# Patient Record
Sex: Female | Born: 1985 | Race: White | Hispanic: No | Marital: Single | Smoking: Former smoker
Health system: Southern US, Community
[De-identification: ages and names within clinical notes are randomized; demographics above are authoritative.]

## PROBLEM LIST (undated history)

## (undated) DIAGNOSIS — F988 Other specified behavioral and emotional disorders with onset usually occurring in childhood and adolescence: Secondary | ICD-10-CM

## (undated) HISTORY — PX: CERVICAL DISCECTOMY: SHX98

## (undated) HISTORY — DX: Other specified behavioral and emotional disorders with onset usually occurring in childhood and adolescence: F98.8

---
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
## 2008-10-21 ENCOUNTER — Emergency Department (HOSPITAL_BASED_OUTPATIENT_CLINIC_OR_DEPARTMENT_OTHER): Admission: EM | Admit: 2008-10-21 | Discharge: 2008-10-21 | Payer: Self-pay | Admitting: Emergency Medicine

## 2010-06-07 ENCOUNTER — Encounter: Admission: RE | Admit: 2010-06-07 | Discharge: 2010-06-07 | Payer: Self-pay | Admitting: Family Medicine

## 2011-05-05 NOTE — Op Note (Signed)
NAME:  Wise Wise                              ACCOUNT NO.:  [id number]   MEDICAL RECORD NO.:  [id number]                   PATIENT TYPE:  OIB   LOCATION:  2866                                 FACILITY:  [facility]   PHYSICIAN:  Kinnie Scales. Annalee Genta, M.D.            DATE OF BIRTH:  09-30-1986   DATE OF PROCEDURE:  07/20/2004  DATE OF DISCHARGE:                                 OPERATIVE REPORT   PREOPERATIVE DIAGNOSIS:  Right superior neck cyst.   POSTOPERATIVE DIAGNOSIS:  Right superior neck cyst.   PROCEDURE:  Transcervical excision of right superior neck cyst.   ANESTHESIA:  General endotracheal anesthesia.   SURGEON:  Kinnie Scales. Annalee Genta, M.D.   ASSISTANT:  Suzanna Obey, M.D.   COMPLICATIONS:  None.   ESTIMATED BLOOD LOSS:  Less than 50 mL.   DISPOSITION:  The patient is transferred from the operating room to the  recovery room in stable condition.   BRIEF HISTORY:  Wise Wise is a 25 year old female who was referred by her family  physician, Dr. Arma Heading, for evaluation of an enlarging right superior neck  mass.  The patient had an antecedent history of acute upper respiratory  tract infection approximately one month ago with swelling in the right neck.  She was treated with a course of antibiotics and referred to our office when  the neck mass did not resolve.  Evaluation in the office revealed a large,  firm neck mass involving the superior aspect of the right neck.  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  The patient had no skin fistula or other abnormality and no  prior history.  Given her history and examination including CT scan, I  recommended transcervical excisional biopsy under general anesthesia.  The  risks, benefits, and possible complications of the surgical procedure were  discussed in detail with the patient's parents and family who  understood and  concurred with our plan for surgery and surgery was scheduled as above.   SURGICAL PROCEDURE:  The patient was brought to the operating room on July 20, 2004, and placed in supine position on the operating table. General  endotracheal anesthesia was established without difficulty and the patient  was adequately anesthetized.  Her neck was injected with 3 mL of 1%  lidocaine with 1:100,000 epinephrine injected in the proposed skin incision  site in the right superior lateral neck.  The patient was then prepped and  draped in a sterile fashion.  The surgical procedure was begun by creating  an approximately 5 cm horizontally oriented skin incision in a pre-existing  right superolateral neck crease.  The incision was carried through the skin  and subcutaneous tissue to the level of the platysmal muscle.  The platysmal  muscle  was divided and subplatysmal flaps were elevated superiorly and  inferiorly.  There was a significant amount of erythema and phlegmon from  the patient's prior infection.  The tissues were quite densely adherent.  Dissection was carried out along the anterior aspect of the right  sternocleidomastoid muscle reflecting the muscle posteriorly.  The cyst was  then encountered along the anterior and deep aspect of the  sternocleidomastoid muscle.  In dissecting around the cyst, the cyst  contents were ruptured.  The cyst capsule was then carefully dissected from  the jugular vein, sternocleidomastoid muscle, and deep neck structures.  Dissection was carried from inferior to superior to the level of the  digastric muscle where the cyst was completely removed.  There was no  further extension.  No other evidence of lymphadenopathy, mass, or tumor.  The patient's wound was then thoroughly irrigated with saline solution.  A 7  mm Blake drain was then placed through the base of the incision through a  separate small stab incision and sutured into position with  3-0 Ethilon.  The patient's wound was closed in multiple layers with reapproximation of  the anterior aspect of the sternocleidomastoid muscle with the deep neck  fascia, the platysmal muscle was reapproximated with 4-0 Vicryl suture, the  deep subcutaneous tissue was closed with a 5-0 Vicryl suture, and final skin  closure was achieved with a 5-0 Ethilon suture.  The patient's skin was then  cleaned and dressed with Bacitracin ointment.  She was awakened from her  anesthetic.  She was extubated and transferred from the operating room to  the recovery room in stable condition.  There are no complications.  Blood  loss approximately 50 mL.                                               Kinnie Scales. Annalee Genta, M.D.    DLS/MEDQ  D:  91/47/8295  T:  07/20/2004  Job:  [id number]

## 2012-01-17 ENCOUNTER — Other Ambulatory Visit: Payer: Self-pay | Admitting: Physician Assistant

## 2012-04-17 ENCOUNTER — Ambulatory Visit
Admission: RE | Admit: 2012-04-17 | Discharge: 2012-04-17 | Disposition: A | Discharge status: Home / Self Care | Payer: No Typology Code available for payment source | Source: Ambulatory Visit | Attending: Infectious Diseases | Admitting: Infectious Diseases

## 2012-04-17 ENCOUNTER — Other Ambulatory Visit: Payer: Self-pay | Admitting: Infectious Diseases

## 2012-04-17 DIAGNOSIS — R7611 Nonspecific reaction to tuberculin skin test without active tuberculosis: Secondary | ICD-10-CM

## 2012-04-20 ENCOUNTER — Telehealth: Payer: Self-pay

## 2012-04-20 ENCOUNTER — Other Ambulatory Visit: Payer: Self-pay | Admitting: Physician Assistant

## 2012-04-20 NOTE — Telephone Encounter (Signed)
Patient would like valtrex 1000mg  filled.

## 2012-04-20 NOTE — Telephone Encounter (Signed)
Pt states that the medication that she is needing a refill on is for cold sores, pt also states that she needs this medication filled today.

## 2012-04-20 NOTE — Telephone Encounter (Signed)
Pt called to get refill of medication, unsure of med name.

## 2012-04-21 NOTE — Telephone Encounter (Signed)
LMOM THAT RX WAS SENT IN 

## 2012-04-21 NOTE — Telephone Encounter (Signed)
Done yesterday.

## 2012-06-03 ENCOUNTER — Telehealth: Payer: Self-pay

## 2012-06-03 ENCOUNTER — Other Ambulatory Visit: Payer: Self-pay | Admitting: Physician Assistant

## 2012-06-13 ENCOUNTER — Telehealth: Payer: Self-pay

## 2012-06-13 NOTE — Telephone Encounter (Signed)
Pt called stated needs refill on an anti fungal prescription that she was given over a year ago by Dr Patsy Lager. Please call pt to discuss this refill. [phone] pharmacy [facility] [address]

## 2012-06-14 NOTE — Telephone Encounter (Signed)
Please advise 

## 2012-06-14 NOTE — Telephone Encounter (Signed)
Chart pulled to PA 

## 2012-06-14 NOTE — Telephone Encounter (Signed)
She needs an office visit 

## 2012-06-14 NOTE — Telephone Encounter (Signed)
Please pull paper chart.  

## 2012-06-15 NOTE — Telephone Encounter (Signed)
LMOM advising OV needed.

## 2012-07-05 ENCOUNTER — Encounter: Payer: Self-pay | Admitting: Physician Assistant

## 2012-07-05 ENCOUNTER — Ambulatory Visit (INDEPENDENT_AMBULATORY_CARE_PROVIDER_SITE_OTHER): Payer: BC Managed Care – PPO | Admitting: Physician Assistant

## 2012-07-05 VITALS — BP 105/74 | HR 80 | Temp 97.1°F | Resp 16 | Ht 65.75 in | Wt 133.0 lb

## 2012-07-05 DIAGNOSIS — R635 Abnormal weight gain: Secondary | ICD-10-CM

## 2012-07-05 DIAGNOSIS — R5383 Other fatigue: Secondary | ICD-10-CM

## 2012-07-05 DIAGNOSIS — B360 Pityriasis versicolor: Secondary | ICD-10-CM

## 2012-07-05 DIAGNOSIS — Z01419 Encounter for gynecological examination (general) (routine) without abnormal findings: Secondary | ICD-10-CM

## 2012-07-05 DIAGNOSIS — Z13220 Encounter for screening for lipoid disorders: Secondary | ICD-10-CM

## 2012-07-05 DIAGNOSIS — Z0000 Encounter for general adult medical examination without abnormal findings: Secondary | ICD-10-CM

## 2012-07-05 DIAGNOSIS — Z124 Encounter for screening for malignant neoplasm of cervix: Secondary | ICD-10-CM

## 2012-07-05 LAB — COMPREHENSIVE METABOLIC PANEL
Albumin: 3.8 g/dL (ref 3.5–5.2)
CO2: 27 mEq/L (ref 19–32)
Calcium: 9 mg/dL (ref 8.4–10.5)
Chloride: 105 mEq/L (ref 96–112)
Creat: 0.75 mg/dL (ref 0.50–1.10)
Glucose, Bld: 74 mg/dL (ref 70–99)
Potassium: 3.8 mEq/L (ref 3.5–5.3)
Total Bilirubin: 0.6 mg/dL (ref 0.3–1.2)
Total Protein: 6.6 g/dL (ref 6.0–8.3)

## 2012-07-05 LAB — CBC WITH DIFFERENTIAL/PLATELET
Lymphocytes Relative: 31 % (ref 12–46)
Lymphs Abs: 2.3 10*3/uL (ref 0.7–4.0)
Neutrophils Relative %: 63 % (ref 43–77)
Platelets: 244 10*3/uL (ref 150–400)

## 2012-07-05 LAB — LIPID PANEL
Cholesterol: 198 mg/dL (ref 0–200)
HDL: 88 mg/dL (ref >39)
LDL Cholesterol: 95 mg/dL (ref 0–99)
Total CHOL/HDL Ratio: 2.3 Ratio
VLDL: 15 mg/dL (ref 0–40)

## 2012-07-05 LAB — POCT URINALYSIS DIPSTICK
Bilirubin, UA: NEGATIVE
Glucose, UA: NEGATIVE
Ketones, UA: NEGATIVE

## 2012-07-05 MED ORDER — KETOCONAZOLE 200 MG PO TABS: 200.0000 mg | ORAL_TABLET | Freq: Every day | ORAL | Status: DC

## 2012-07-05 MED ORDER — BUPROPION HCL 100 MG PO TABS: 100.0000 mg | ORAL_TABLET | Freq: Two times a day (BID) | ORAL | Status: DC

## 2012-07-05 MED ORDER — VALACYCLOVIR HCL 1 G PO TABS: 1000.0000 mg | ORAL_TABLET | Freq: Every day | ORAL | Status: DC

## 2012-07-05 MED ORDER — DESOGESTREL-ETHINYL ESTRADIOL 0.15-30 MG-MCG PO TABS: 1.0000 | ORAL_TABLET | Freq: Every day | ORAL | Status: DC

## 2012-07-05 NOTE — Progress Notes (Signed)
  Subjective:    Patient ID: Michele Wise, female    DOB: May 14, 1986, 26 y.o.   MRN: [id number]  HPI 26 yr old CF comes in for a CPE.  She has a few things to discuss.  She has a history of having tinea versicolor a couple of years ago and she seems to have developed the same type skin changes again. She is concerned with her weight. She is having obsessive thoughts about food.  Denies anxiety or depression, but does sometimes eat out of boredom and she works at a desk.  Her weight tends to fluctuate bt 130-140.  She works out regularly.  Sugar and carbs are the hardest to stay away from.  She denies   Review of Systems  All other systems reviewed and are negative.       Objective:   Physical Exam  Nursing note and vitals reviewed. Constitutional: She is oriented to person, place, and time. She appears well-developed and well-nourished.  HENT:  Head: Normocephalic and atraumatic.  Right Ear: External ear normal.  Left Ear: External ear normal.  Nose: Nose normal.  Mouth/Throat: Oropharynx is clear and moist. No oropharyngeal exudate.  Eyes: Conjunctivae and EOM are normal. Pupils are equal, round, and reactive to light.       Fundi benign B  Neck: Normal range of motion. Neck supple. No thyromegaly present.  Cardiovascular: Normal rate, regular rhythm, normal heart sounds and intact distal pulses.  Exam reveals no gallop and no friction rub.   No murmur heard. Pulmonary/Chest: Effort normal and breath sounds normal. Right breast exhibits no inverted nipple, no mass, no nipple discharge, no skin change and no tenderness. Left breast exhibits no inverted nipple, no mass, no nipple discharge, no skin change and no tenderness. Breasts are symmetrical.  Abdominal: Soft. Bowel sounds are normal.  Genitourinary: Vagina normal and uterus normal.       Pap smear taken. Bimanual unremarkable  Musculoskeletal: Normal range of motion. She exhibits no edema and no tenderness.       No scoliosis    Lymphadenopathy:    She has no cervical adenopathy.  Neurological: She is alert and oriented to person, place, and time. She has normal reflexes.  Skin: Skin is warm and dry. Rash (areas of hypopigmentation and slight scale on chest and back-ranging from 3-25mm, well demarcated.) noted.  Psychiatric: She has a normal mood and affect. Her behavior is normal. Thought content normal.   Results for orders placed in visit on 07/05/12  POCT URINALYSIS DIPSTICK      Component Value Range   Color, UA yellow     Clarity, UA clear     Glucose, UA neg     Bilirubin, UA neg     Ketones, UA neg     Spec Grav, UA 1.025     Blood, UA neg     pH, UA 7.0     Protein, UA neg     Urobilinogen, UA 0.2     Nitrite, UA neg     Leukocytes, UA Negative         Assessment & Plan:  CPE-normal exam Fatigue-proper rest and nutrition reviewed.  Checking labs. Weight concerns/obsessive thinking-try wellbutrin.  Titrate to bid.  Call me in 4-5 weeks.  Consider counseling for learning mechanisms to help with preoccupation with food. Tinea versicolor-nizoral then after treatment, use selsun blue shampoo to bathe in once/week for prevention.

## 2012-07-06 LAB — VITAMIN D 25 HYDROXY (VIT D DEFICIENCY, FRACTURES): Vit D, 25-Hydroxy: 53 ng/mL (ref 30–89)

## 2012-07-10 LAB — PAP IG, CT-NG, RFX HPV ASCU

## 2013-01-27 ENCOUNTER — Telehealth: Payer: Self-pay

## 2013-01-27 NOTE — Telephone Encounter (Signed)
Pt returned our call but I was unable to get it at that moment. When I called back, she didn't answer again, so I left another VM to call back

## 2013-01-27 NOTE — Telephone Encounter (Signed)
We haven't seen this pt since 05/2012, at which time we left a message letting her know that all of her labs were normal, including her pap. LMOM to CB.

## 2013-01-27 NOTE — Telephone Encounter (Signed)
Pt was inquiring about her thyroid. Advised that it was normal.

## 2013-01-27 NOTE — Telephone Encounter (Signed)
PT NEVER RECEIVED HER LAB RESULTS WHEN SHE WAS HERE. WOULD LIKE [place] TO CALL HER AND LET HER KNOW IF HER THYROID LEVELS WERE OK OR NOT PLEASE CALL [phone]

## 2013-06-21 ENCOUNTER — Ambulatory Visit (INDEPENDENT_AMBULATORY_CARE_PROVIDER_SITE_OTHER): Payer: BC Managed Care – PPO | Admitting: Family Medicine

## 2013-06-21 VITALS — BP 110/62 | HR 72 | Temp 98.2°F | Resp 18 | Ht 67.0 in | Wt 132.0 lb

## 2013-06-21 DIAGNOSIS — R5381 Other malaise: Secondary | ICD-10-CM

## 2013-06-21 DIAGNOSIS — F909 Attention-deficit hyperactivity disorder, unspecified type: Secondary | ICD-10-CM

## 2013-06-21 LAB — POCT CBC
Granulocyte percent: 62.6 %G (ref 37–80)
HCT, POC: 41.7 % (ref 37.7–47.9)
MCHC: 31.4 g/dL — AB (ref 31.8–35.4)
MCV: 93.5 fL (ref 80–97)
MPV: 8 fL (ref 0–99.8)
POC Granulocyte: 5.6 (ref 2–6.9)
POC LYMPH PERCENT: 31.1 %L (ref 10–50)
RBC: 4.46 M/uL (ref 4.04–5.48)
WBC: 9 10*3/uL (ref 4.6–10.2)

## 2013-06-21 NOTE — Progress Notes (Addendum)
[facility] [address], [place] [address] [phone]  Date:  06/21/2013   Name:  Michele Wise   DOB:  11/17/1986   MRN:  [id number]  PCP:  No primary provider on file.    Chief Complaint: Advice Only   History of Present Illness:  [name] is a 27 y.o. very pleasant female patient who presents with the following:  Patient states that she is tired all the time, has low energy, gets distracted at work. Has been going on for about 6 months. She states that she will sleep up to 14 hours on the weekends when she has time. However, she does not feel depressed, and is able to enjoy doing things with her friends on the weekends.  Her job is not stressful per her report.   Has not started on any new medications or vitamins.  Her sister has ADHD, and Michele Wise feels she may have it too.  Despite feeling tired a lot, she also finds that she is very distractable and has a hard time finishing projects.  She tends to fidgit a lot, and will interrupt others.  She has never been treated for adhd in the past.  Just had her LMP, no chance of pregnancy    There are no active problems to display for this patient.   History reviewed. No pertinent past medical history.  History reviewed. No pertinent past surgical history.  History  Substance Use Topics  . Smoking status: Former Smoker -- 3 years    Types: Cigarettes    Quit date: 10/18/2009  . Smokeless tobacco: Not on file     Comment: smoked 5 cigarettes a week  . Alcohol Use: Yes     Comment: mixed drinks    Family History  Problem Relation Age of Onset  . Hyperlipidemia Mother   . Hyperlipidemia Father   . Heart disease Maternal Grandmother     heart attack, pacemaker  . Cancer Maternal Grandfather 27    colon cancer    No Known Allergies  Medication list has been reviewed and updated.  Current Outpatient Prescriptions on File Prior to Visit  Medication Sig Dispense Refill  . desogestrel-ethinyl estradiol  (APRI,EMOQUETTE,SOLIA) 0.15-30 MG-MCG tablet Take 1 tablet by mouth daily.  3 Package  4  . buPROPion (WELLBUTRIN) 100 MG tablet Take 1 tablet (100 mg total) by mouth 2 (two) times daily.  60 tablet  3  . ketoconazole (NIZORAL) 200 MG tablet Take 1 tablet (200 mg total) by mouth daily.  10 tablet  0  . valACYclovir (VALTREX) 1000 MG tablet Take 1 tablet (1,000 mg total) by mouth daily. TAKE 2 TABLETS BY MOUTH THEN REPEAT DOSE IN 12 HOURS FOR EACH OUTBREAK (4 TABLETS PER OUTBREAK)  90 tablet  3   No current facility-administered medications on file prior to visit.    Review of Systems:  General: Denies fever and chills. malise HEENT: Has a distant history of migranes, currently has headaches associated with menstral cycle. Denies changes in vision/hearing MV:HQIONG chest pain Resp: Denies SOB, wheezes  GI: Has a large appettite, eats a lot. Weight flucates.  GU: Musc: Denies any joint/ muscle pain Skin:  Denies eating a lot of take out, denies stress, hours of sleep at night varies- usually about 6-8 hours. Works full time as a Chief Technology Officer. Distracted at work, unable to focus on anything. Procastinates a lot. Notices that she is impulsive when has an idea or needs to speak.  Had good grades in school.   Has stopped taking the Wellbutrin- she only took it for 2 weeks, stopped because it did not seem to be helping.    Physical Examination: Filed Vitals:   06/21/13 1753  BP: 110/62  Pulse: 72  Temp: 98.2 F (36.8 C)  Resp: 18   Filed Vitals:   06/21/13 1753  Height: 5\' 7"  (1.702 m)  Weight: 132 lb (59.875 kg)   Body mass index is 20.67 kg/(m^2). Ideal Body Weight: Weight in (lb) to have BMI = 25: 159.3  GEN: WDWN, NAD, Non-toxic, A & O x 3, looks well HEENT: Atraumatic, Normocephalic. Neck supple. No masses, No LAD. Ears and Nose: No external deformity. CV: RRR, No M/G/R. No JVD. No thrill. No extra heart sounds. PULM: CTA B, no wheezes, crackles, rhonchi. No  retractions. No resp. distress. No accessory muscle use. ABD: S, NT, ND. No rebound. No HSM. EXTR: No c/c/e NEURO Normal gait.  PSYCH: Normally interactive. Conversant. Not depressed or anxious appearing.  Calm demeanor.   Results for orders placed in visit on 06/21/13  POCT CBC      Result Value Range   WBC 9.0  4.6 - 10.2 K/uL   Lymph, poc 2.8  0.6 - 3.4   POC LYMPH PERCENT 31.1  10 - 50 %L   MID (cbc) 0.6  0 - 0.9   POC MID % 6.3  0 - 12 %M   POC Granulocyte 5.6  2 - 6.9   Granulocyte percent 62.6  37 - 80 %G   RBC 4.46  4.04 - 5.48 M/uL   Hemoglobin 13.1  12.2 - 16.2 g/dL   HCT, POC 08.6  57.8 - 47.9 %   MCV 93.5  80 - 97 fL   MCH, POC 29.4  27 - 31.2 pg   MCHC 31.4 (*) 31.8 - 35.4 g/dL   RDW, POC 46.9     Platelet Count, POC 248  142 - 424 K/uL   MPV 8.0  0 - 99.8 fL    Assessment and Plan: Other malaise and fatigue - Plan: POCT CBC, Comprehensive metabolic panel, TSH, Vitamin D, 25-hydroxy  Completed adhd self- assessment which was markedly positive.  Will await her other labs and be in touch with her.  Assuming labs ok will plan to start adderall.    Signed Abbe Amsterdam, MD  7/8- called her to discuss her labs-  Results for orders placed in visit on 06/21/13  COMPREHENSIVE METABOLIC PANEL      Result Value Range   Sodium 139  135 - 145 mEq/L   Potassium 4.0  3.5 - 5.3 mEq/L   Chloride 104  96 - 112 mEq/L   CO2 28  19 - 32 mEq/L   Glucose, Bld 81  70 - 99 mg/dL   BUN 13  6 - 23 mg/dL   Creat 6.29  5.28 - 4.13 mg/dL   Total Bilirubin 0.6  0.3 - 1.2 mg/dL   Alkaline Phosphatase 47  39 - 117 U/L   AST 15  0 - 37 U/L   ALT 9  0 - 35 U/L   Total Protein 7.0  6.0 - 8.3 g/dL   Albumin 3.8  3.5 - 5.2 g/dL   Calcium 9.2  8.4 - 24.4 mg/dL  TSH      Result Value Range   TSH 0.507  0.350 - 4.500 uIU/mL  VITAMIN D 25 HYDROXY      Result Value Range  Vit D, 25-Hydroxy 44  30 - 89 ng/mL  POCT CBC      Result Value Range   WBC 9.0  4.6 - 10.2 K/uL   Lymph, poc  2.8  0.6 - 3.4   POC LYMPH PERCENT 31.1  10 - 50 %L   MID (cbc) 0.6  0 - 0.9   POC MID % 6.3  0 - 12 %M   POC Granulocyte 5.6  2 - 6.9   Granulocyte percent 62.6  37 - 80 %G   RBC 4.46  4.04 - 5.48 M/uL   Hemoglobin 13.1  12.2 - 16.2 g/dL   HCT, POC 08.6  57.8 - 47.9 %   MCV 93.5  80 - 97 fL   MCH, POC 29.4  27 - 31.2 pg   MCHC 31.4 (*) 31.8 - 35.4 g/dL   RDW, POC 46.9     Platelet Count, POC 248  142 - 424 K/uL   MPV 8.0  0 - 99.8 fL   Her labs look ok.  She would like to try some medication for ADHD.  Will start her on a low dose of adderall.  She will give me an update in the next few weeks.

## 2013-06-22 LAB — COMPREHENSIVE METABOLIC PANEL
Alkaline Phosphatase: 47 U/L (ref 39–117)
BUN: 13 mg/dL (ref 6–23)
Glucose, Bld: 81 mg/dL (ref 70–99)
Sodium: 139 mEq/L (ref 135–145)

## 2013-06-22 LAB — TSH: TSH: 0.507 u[IU]/mL (ref 0.350–4.500)

## 2013-06-25 MED ORDER — AMPHETAMINE-DEXTROAMPHETAMINE 5 MG PO TABS: ORAL_TABLET | ORAL | Status: DC

## 2013-06-25 NOTE — Addendum Note (Signed)
Addended by: Abbe Amsterdam C on: 06/25/2013 07:55 AM   Modules accepted: Orders, Medications

## 2013-07-07 ENCOUNTER — Telehealth: Payer: Self-pay

## 2013-07-07 NOTE — Telephone Encounter (Signed)
Patient is calling to give Dr. Patsy Lager a follow up. States that she has started Adderrall and feels that the dosage is too low. Is currently taking 5mg  2x daily.  [phone].

## 2013-07-09 NOTE — Telephone Encounter (Signed)
Please advise 

## 2013-07-09 NOTE — Telephone Encounter (Signed)
Called- no answer so left message on her VM.  Let's try increasing to 10mg  am and 5 mg pm.  Let me know when she is getting towards the end of her bottle of medication- it will not last a full month.  Apologized for delay- I just got this message tonight.

## 2013-07-14 ENCOUNTER — Telehealth: Payer: Self-pay

## 2013-07-14 DIAGNOSIS — F909 Attention-deficit hyperactivity disorder, unspecified type: Secondary | ICD-10-CM

## 2013-07-14 NOTE — Telephone Encounter (Signed)
Pt wanted to ask Dr. Patsy Lager about her Addreall, she called last week she believes and was told to start taking her Addreall 3 times a day instead of 2, she is about to run out and she is wondering when she can get another refill Call back number is [phone]

## 2013-07-15 MED ORDER — AMPHETAMINE-DEXTROAMPHETAMINE 5 MG PO TABS: ORAL_TABLET | ORAL | Status: DC

## 2013-07-15 NOTE — Telephone Encounter (Signed)
Pended at last dose.

## 2013-07-15 NOTE — Telephone Encounter (Signed)
Called her back but [facility]- rx is ready for her at desk.  Will use 5mg , 2 am and 1 pm.  Please give me an update in a couple of weeks.

## 2013-07-16 ENCOUNTER — Telehealth: Payer: Self-pay

## 2013-07-16 DIAGNOSIS — F988 Other specified behavioral and emotional disorders with onset usually occurring in childhood and adolescence: Secondary | ICD-10-CM

## 2013-07-16 MED ORDER — AMPHETAMINE-DEXTROAMPHETAMINE 10 MG PO TABS: 10.0000 mg | ORAL_TABLET | Freq: Two times a day (BID) | ORAL | Status: DC

## 2013-07-16 MED ORDER — AMPHETAMINE-DEXTROAMPHETAMINE 10 MG PO TABS: 10.0000 mg | ORAL_TABLET | Freq: Every day | ORAL | Status: DC

## 2013-07-16 NOTE — Telephone Encounter (Signed)
[facility] MEDICATION SINCE DR. COPLAND CHANGED THE DOSAGE amphetamine-dextroamphetamine (ADDERALL) 5 MG tablet [phone]

## 2013-07-16 NOTE — Telephone Encounter (Signed)
Pt called back and she would like to try the 10mg  twice a day.  The other paper rx, she gave to [facility] and they have it for the prior auth.  We can call them to cancel it when you do the other one if you like.

## 2013-07-16 NOTE — Telephone Encounter (Addendum)
Called her back and LMOM- why don't we try 10 mg BID.  She can try a 1/2 tablet in the evening at first.  Will do a new rx for her.  She will bring in the other paper rx (not filled yet) and change it out

## 2013-07-16 NOTE — Telephone Encounter (Signed)
Pt CB and reported that the new TID dosing of Adderall 5 mg needs a PA. In talking w/the pt she stated that the 5 mg is not really effective even for a short period of time, and may need to try a higher dose BID instead.

## 2013-07-17 NOTE — Telephone Encounter (Signed)
Called Sua- she has already picked up the 10mg  BID rx, and will start using this.  Recommended that she take 5mg  qpm at first.  [facility]ed [facility] to cancel old rx.

## 2013-08-03 ENCOUNTER — Telehealth: Payer: Self-pay

## 2013-08-03 ENCOUNTER — Other Ambulatory Visit: Payer: Self-pay | Admitting: Physician Assistant

## 2013-08-03 DIAGNOSIS — F988 Other specified behavioral and emotional disorders with onset usually occurring in childhood and adolescence: Secondary | ICD-10-CM

## 2013-08-03 NOTE — Telephone Encounter (Signed)
Refill sent.

## 2013-08-03 NOTE — Telephone Encounter (Signed)
Patient says she needs a refill on her birth control (patient says she is on her last pill). Pharmacy [facility] [address] and says she is unable to make her Appt. for her pap. She says she will reschedule.   Wants to let Dr. Patsy Lager know that the RX given;  Adderall 10 mg- makes her feel a little better than 5 mg. An hour and two later helps for a little bit only then she feels the same as before. Not sure when she supposed to get a refill on that.   Please advise.   [phone]

## 2013-08-04 MED ORDER — AMPHETAMINE-DEXTROAMPHETAMINE 10 MG PO TABS: 15.0000 mg | ORAL_TABLET | Freq: Two times a day (BID) | ORAL | Status: DC

## 2013-08-04 NOTE — Telephone Encounter (Signed)
Called and LMOM.  It looks like she got her OCP refill.  Will increase to adderall 15 BID at next RF- she can increase to 1.5 of the 10mg  tabs BID over the course of a week.   Will place Rx at front- can be filled at the end of this month.

## 2013-08-04 NOTE — Addendum Note (Signed)
Addended by: Abbe Amsterdam C on: 08/04/2013 08:57 PM   Modules accepted: Orders

## 2013-08-04 NOTE — Telephone Encounter (Signed)
Left message RX refilled and sent to pharmacy

## 2013-08-05 ENCOUNTER — Other Ambulatory Visit: Payer: Self-pay | Admitting: Family Medicine

## 2013-08-05 DIAGNOSIS — F988 Other specified behavioral and emotional disorders with onset usually occurring in childhood and adolescence: Secondary | ICD-10-CM

## 2013-08-05 MED ORDER — AMPHETAMINE-DEXTROAMPHETAMINE 10 MG PO TABS: 15.0000 mg | ORAL_TABLET | Freq: Two times a day (BID) | ORAL | Status: DC

## 2013-08-18 ENCOUNTER — Telehealth: Payer: Self-pay

## 2013-08-18 DIAGNOSIS — F988 Other specified behavioral and emotional disorders with onset usually occurring in childhood and adolescence: Secondary | ICD-10-CM

## 2013-08-18 NOTE — Telephone Encounter (Signed)
Prior Berkley Harvey is needed for Adderall 10 mg, 1 1/2 tab BID (plan only covers 2 tabs QD, but will cover 2 tabs of the 15 mg within the plan). Spoke w/pt and she reported she has been doing well on the 15 mg BID dose and would like to just change to the 15 mg tablets. It may still require a PA but will be approved at the #2 of the 15 mg daily. Pt stated that [facility] has the copy of the Rx you wrote her to fill 08/14/13. Pharm does have the Rx but reported that they are out of 15 mg tabs and they are on backorder. I called [facility] and they DO have the 15 mg in stock. Dr Patsy Lager, do you want to write a Rx for the 15 mg?

## 2013-08-19 MED ORDER — AMPHETAMINE-DEXTROAMPHETAMINE 15 MG PO TABS: 15.0000 mg | ORAL_TABLET | Freq: Every day | ORAL | Status: DC

## 2013-08-19 NOTE — Telephone Encounter (Signed)
Left message on machine- will rx the 15mg  tablets.  Please bring in the rx for the 10mg  tabs and trade this out.

## 2013-10-03 ENCOUNTER — Telehealth: Payer: Self-pay

## 2013-10-03 DIAGNOSIS — F988 Other specified behavioral and emotional disorders with onset usually occurring in childhood and adolescence: Secondary | ICD-10-CM

## 2013-10-03 MED ORDER — AMPHETAMINE-DEXTROAMPHETAMINE 15 MG PO TABS: 15.0000 mg | ORAL_TABLET | Freq: Two times a day (BID) | ORAL | Status: DC

## 2013-10-03 NOTE — Telephone Encounter (Signed)
Pt needs adderall refill. Call at [phone].

## 2013-10-03 NOTE — Telephone Encounter (Signed)
Called and LMOM. If the 15mg  BID is not right please let me know. Otherwise please come and see me prior to next refill

## 2013-10-03 NOTE — Telephone Encounter (Signed)
Please advise.  Pended (double check the sig)

## 2013-11-03 ENCOUNTER — Ambulatory Visit (INDEPENDENT_AMBULATORY_CARE_PROVIDER_SITE_OTHER): Payer: BC Managed Care – PPO | Admitting: Family Medicine

## 2013-11-03 VITALS — BP 102/66 | HR 102 | Temp 98.7°F | Resp 20 | Ht 66.0 in | Wt 132.0 lb

## 2013-11-03 DIAGNOSIS — F988 Other specified behavioral and emotional disorders with onset usually occurring in childhood and adolescence: Secondary | ICD-10-CM

## 2013-11-03 DIAGNOSIS — R5381 Other malaise: Secondary | ICD-10-CM

## 2013-11-03 DIAGNOSIS — Z72820 Sleep deprivation: Secondary | ICD-10-CM

## 2013-11-03 DIAGNOSIS — R5383 Other fatigue: Secondary | ICD-10-CM

## 2013-11-03 MED ORDER — AMPHETAMINE-DEXTROAMPHETAMINE 15 MG PO TABS: 15.0000 mg | ORAL_TABLET | Freq: Two times a day (BID) | ORAL | Status: DC

## 2013-11-03 NOTE — Patient Instructions (Signed)
[facility] Policy for Prescribing Controlled Substances (Revised 10/2012) 1. Prescriptions for controlled substances will be filled by ONE provider at [facility] with whom you have established and developed a plan for your care, including follow-up. 2. You are encouraged to schedule an appointment with your prescriber at our appointment center for follow-up visits whenever possible. 3. If you request a prescription for the controlled substance while at [facility] for an acute problem (with someone other than your regular prescriber), you MAY be given a ONE-TIME prescription for a 30-day supply of the controlled substance, to allow time for you to return to see your regular prescriber for additional prescriptions.   Try hard to get enough sleep, 7-8 hours nightly  Get regular exercise

## 2013-11-03 NOTE — Progress Notes (Signed)
Subjective: 27 year old patient of Dr. Durel Salts who was told that if she came in today anybody could prescribe her vyvance for her.  She has been taking it for several months now. She continues to complain of a lot of fatigue. She just doesn't have energy. She's tired in the morning and has a hard time getting out of bed and going for the day. She works a job which keeps her busy from 8 AM to 6 PM. She sometimes has to take work home she is generally sleeping about 6 hours from midnight to 6 AM. She used to get a lot of exercise, but does not now. She is single and lives with her parents. She has been sexually involved in the past, and is on birth control, but has not sexually involved. On review of systems nothing very remarkable stands out. She does have a history of migraine headaches and has had one for the last couple of days. She gets a little dizziness with it with or without that. She has occasionally been taking her blood pressure at home it has gotten to readings with a systolic in the 80s. She wonders whether her blood pressure being low could be a cause of her fatigue. Cardiovascular is otherwise unremarkable. Respiratory unremarkable. GI unremarkable. GU unremarkable. Musculoskeletal unremarkable. She is only on the vyvanse and birth control regularly, and Valtrex when needed for fever blisters.  Objective: Pleasant alert healthy appearing young lady in no major distress. TMs normal. Throat clear. Neck supple without nodes or thyromegaly. Chest clear process. Heart regular without murmurs gallops or arrhythmias. No carotid bruits. Abdomen soft without masses.  Reviewed her extensive labs from this summer and from a year ago.  Assessment: ADHD, for medicine refill History of fever blisters Fatigue Sleep deprivation Deconditioning  Plan: Recommended trying hard to get more sleep and to do more regular exercise. Plan to see Dr. Dallas Schimke back in January at the 104 office.

## 2013-11-06 NOTE — Telephone Encounter (Signed)
error 

## 2013-11-18 ENCOUNTER — Ambulatory Visit (INDEPENDENT_AMBULATORY_CARE_PROVIDER_SITE_OTHER): Payer: BC Managed Care – PPO | Admitting: Family Medicine

## 2013-11-18 VITALS — BP 100/70 | HR 115 | Temp 98.3°F | Resp 16 | Ht 66.0 in | Wt 129.0 lb

## 2013-11-18 DIAGNOSIS — R110 Nausea: Secondary | ICD-10-CM

## 2013-11-18 DIAGNOSIS — Z23 Encounter for immunization: Secondary | ICD-10-CM

## 2013-11-18 DIAGNOSIS — G43909 Migraine, unspecified, not intractable, without status migrainosus: Secondary | ICD-10-CM

## 2013-11-18 DIAGNOSIS — N946 Dysmenorrhea, unspecified: Secondary | ICD-10-CM

## 2013-11-18 MED ORDER — PROMETHAZINE HCL 12.5 MG PO TABS: 12.5000 mg | ORAL_TABLET | Freq: Three times a day (TID) | ORAL | Status: DC | PRN

## 2013-11-18 MED ORDER — HYDROCODONE-ACETAMINOPHEN 5-325 MG PO TABS: 0.5000 | ORAL_TABLET | ORAL | Status: DC | PRN

## 2013-11-18 NOTE — Progress Notes (Signed)
Subjective:    Patient ID: Michele Wise, female    DOB: 10/27/1986, 27 y.o.   MRN: [id number] This chart was scribed for Ethelda Chick, MD by Clydene Laming, ED Scribe. This patient was seen in room 4 and the patient's care was started at 8:48 PM. HPI HPI Comments: Michele Wise is a 27 y.o. female who presents to the [facility] complaining of abdominal pain with an associated migraine. Pt states she missed her birth control for three days which caused her menstrual cycle to begin early. Pt denies any vomiting but states she does feel nauseas and thinks it might be associated with the migraine. Pt usually gets the symptoms during her cycle, but not this severe. Pt also requests an influenza vaccination. 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  There are no active problems to display for this patient.  History reviewed. No pertinent past medical history. No past surgical history on file. No Known Allergies Prior to Admission medications   Medication Sig Start Date End Date Taking? Authorizing Provider  amphetamine-dextroamphetamine (ADDERALL) 15 MG tablet Take 1 tablet (15 mg total) by mouth 2 (two) times daily. 11/03/13  Yes Peyton Najjar, MD  RECLIPSEN 0.15-30 MG-MCG tablet TAKE ONE TABLET BY MOUTH DAILY 08/03/13  Yes Godfrey Pick, PA-C  valACYclovir (VALTREX) 1000 MG tablet Take 1 tablet (1,000 mg total) by mouth daily. TAKE 2 TABLETS BY MOUTH THEN REPEAT DOSE IN 12 HOURS FOR EACH OUTBREAK (4 TABLETS PER OUTBREAK) 07/05/12  Yes Anders Simmonds, PA-C  HYDROcodone-acetaminophen (NORCO) 5-325 MG per tablet Take 0.5 tablets by mouth every 4 (four) hours as needed for moderate pain. 11/18/13   Elvina Sidle, MD  promethazine (PHENERGAN) 12.5 MG tablet Take 1 tablet (12.5 mg total) by mouth every 8 (eight) hours as needed for nausea or vomiting. 11/18/13   Elvina Sidle, MD   History   Social History   Marital Status:  Single    Spouse Name: N/A    Number of Children: N/A   Years of Education: N/A   Occupational History   Not on file.   Social History Main Topics   Smoking status: Former Smoker -- 3 years    Types: Cigarettes    Quit date: 10/18/2009   Smokeless tobacco: Not on file     Comment: smoked 5 cigarettes a week   Alcohol Use: Yes     Comment: mixed drinks   Drug Use: Not on file   Sexual Activity: Not on file   Other Topics Concern   Not on file   Social History Narrative   No narrative on file      Review of Systems     Objective:   Physical Exam  Nursing note and vitals reviewed. Constitutional: She appears well-developed and well-nourished. No distress.  Awake, alert, nontoxic appearance  HENT:  Head: Normocephalic and atraumatic.  Mouth/Throat: Oropharynx is clear and moist. No oropharyngeal exudate.  Eyes: Conjunctivae are normal. No scleral icterus.  Neck: Normal range of motion. Neck supple.  Cardiovascular: Normal rate, regular rhythm and intact distal pulses.   Pulmonary/Chest: Effort normal and breath sounds normal. No respiratory distress. She has no wheezes.  Abdominal: Soft. Bowel sounds are normal. She exhibits no mass. There is tenderness (mild).  Musculoskeletal: Normal range of motion. She exhibits no edema.  Neurological: She is alert.  Speech is clear and goal oriented Moves extremities without ataxia  Skin: Skin is warm and dry. She is not diaphoretic.  Psychiatric: She has a normal mood and affect.   Filed Vitals:   11/18/13 2040  BP: 100/70  Pulse: 115  Temp: 98.3 F (36.8 C)  TempSrc: Oral  Resp: 16  Height: 5\' 6"  (1.676 m)  Weight: 129 lb (58.514 kg)  SpO2: 99%   Abdominal exam benign with active BS and no guarding, rebound, HSM or mass      Assessment & Plan:  8:56 PM- Discussed treatment plan with pt at bedside. Pt verbalized understanding and agreement with plan.  I personally performed the services described in this  documentation, which was scribed in my presence. The recorded information has been reviewed and is accurate. Migraine, unspecified, without mention of intractable migraine without mention of status migrainosus - Plan: HYDROcodone-acetaminophen (NORCO) 5-325 MG per tablet  Nausea alone - Plan: promethazine (PHENERGAN) 12.5 MG tablet  Dysmenorrhea - Plan: HYDROcodone-acetaminophen (NORCO) 5-325 MG per tablet  Need for prophylactic vaccination and inoculation against influenza - Plan: Flu Vaccine QUAD 36+ mos IM  Signed, Elvina Sidle, MD

## 2013-11-19 ENCOUNTER — Telehealth: Payer: Self-pay

## 2013-11-19 NOTE — Telephone Encounter (Signed)
Note provided called her to advise.

## 2013-11-19 NOTE — Telephone Encounter (Signed)
PT STATES SHE WAS GIVEN AN OOW NOTE AND TOLD IF SHE NEEDED MORE TO LET us KNOW AND SHE WOULD LIKE TO HAVE ANOTHER NOTE FOR TODAY ONLY PLEASE CALL [phone] AND SHE WILL COME PICK UP

## 2013-12-02 ENCOUNTER — Telehealth: Payer: Self-pay

## 2013-12-02 NOTE — Telephone Encounter (Signed)
Will you replace the lost Rx?

## 2013-12-02 NOTE — Telephone Encounter (Signed)
Patient lost her script for adderall    Needs a new one - due tomorrow   [phone]

## 2013-12-03 ENCOUNTER — Telehealth: Payer: Self-pay

## 2013-12-03 NOTE — Telephone Encounter (Signed)
Patient calling again about her rx that was lost [phone]

## 2013-12-03 NOTE — Telephone Encounter (Signed)
Note sent to Dr Alwyn Ren he is out of the office. Called her to advise. Explained our controlled substances policy.

## 2013-12-04 ENCOUNTER — Ambulatory Visit (INDEPENDENT_AMBULATORY_CARE_PROVIDER_SITE_OTHER): Payer: BC Managed Care – PPO | Admitting: Family Medicine

## 2013-12-04 VITALS — BP 100/60 | HR 77 | Temp 98.9°F | Resp 16 | Ht 66.5 in | Wt 138.0 lb

## 2013-12-04 DIAGNOSIS — F988 Other specified behavioral and emotional disorders with onset usually occurring in childhood and adolescence: Secondary | ICD-10-CM

## 2013-12-04 MED ORDER — AMPHETAMINE-DEXTROAMPHETAMINE 15 MG PO TABS: 15.0000 mg | ORAL_TABLET | Freq: Two times a day (BID) | ORAL | Status: DC

## 2013-12-04 NOTE — Telephone Encounter (Signed)
I cannot refill this.  She needs to come in and talk with Dr. Patsy Lager and discuss her meds.  Dr. Patsy Lager can decide if she warrents another chance.

## 2013-12-04 NOTE — Telephone Encounter (Signed)
Thank you. I have called her to advise her to return to clinic.

## 2013-12-04 NOTE — Progress Notes (Signed)
[facility] [address] [place] [address] [phone]  Date:  12/04/2013   Name:  Michele Wise   DOB:  28-Aug-1986   MRN:  [id number]  PCP:  No PCP Per Patient    Chief Complaint: Medication Refill   History of Present Illness:  Michele Wise is a 27 y.o. very pleasant female patient who presents with the following:  She is here for a recheck of her medication for ADHD.  We started this earlier this year.  She is currently on adderall 15 mg BID.  She is only getting about 6 hours of sleep a night and admits this might contribute to her fatigue.  She plan to try and get more sleep.  She thinks that her ability to focus and get things done is better.  She has not noted any bothersome SE  Wt Readings from Last 3 Encounters:  12/04/13 138 lb (62.596 kg)  11/18/13 129 lb (58.514 kg)  11/03/13 132 lb (59.875 kg)     LMP 11/30  There are no active problems to display for this patient.   No past medical history on file.  No past surgical history on file.  History  Substance Use Topics  . Smoking status: Former Smoker -- 3 years    Types: Cigarettes    Quit date: 10/18/2009  . Smokeless tobacco: Not on file     Comment: smoked 5 cigarettes a week  . Alcohol Use: Yes     Comment: mixed drinks    Family History  Problem Relation Age of Onset  . Hyperlipidemia Mother   . Hyperlipidemia Father   . Heart disease Maternal Grandmother     heart attack, pacemaker  . Cancer Maternal Grandfather 24    colon cancer    No Known Allergies  Medication list has been reviewed and updated.  Current Outpatient Prescriptions on File Prior to Visit  Medication Sig Dispense Refill  . amphetamine-dextroamphetamine (ADDERALL) 15 MG tablet Take 1 tablet (15 mg total) by mouth 2 (two) times daily.  60 tablet  0  . RECLIPSEN 0.15-30 MG-MCG tablet TAKE ONE TABLET BY MOUTH DAILY  3 Package  4  . valACYclovir (VALTREX) 1000 MG tablet Take 1 tablet (1,000 mg total) by  mouth daily. TAKE 2 TABLETS BY MOUTH THEN REPEAT DOSE IN 12 HOURS FOR EACH OUTBREAK (4 TABLETS PER OUTBREAK)  90 tablet  3   No current facility-administered medications on file prior to visit.    Review of Systems:  As per HPI- otherwise negative.   Physical Examination: Filed Vitals:   12/04/13 1846  BP: 98/58  Pulse: 77  Temp: 98.9 F (37.2 C)  Resp: 16   Filed Vitals:   12/04/13 1846  Height: 5' 6.5" (1.689 m)  Weight: 138 lb (62.596 kg)   Body mass index is 21.94 kg/(m^2). Ideal Body Weight: Weight in (lb) to have BMI = 25: 156.9  GEN: WDWN, NAD, Non-toxic, A & O x 3, looks well HEENT: Atraumatic, Normocephalic. Neck supple. No masses, No LAD. Ears and Nose: No external deformity. CV: RRR, No M/G/R. No JVD. No thrill. No extra heart sounds. PULM: CTA B, no wheezes, crackles, rhonchi. No retractions. No resp. distress. No accessory muscle use. EXTR: No c/c/e NEURO Normal gait.  PSYCH: Normally interactive. Conversant. Not depressed or anxious appearing.  Calm demeanor.    Assessment and Plan: ADD (attention deficit disorder) - Plan: amphetamine-dextroamphetamine (ADDERALL) 15 MG tablet, amphetamine-dextroamphetamine (ADDERALL) 15 MG tablet, DISCONTINUED:  amphetamine-dextroamphetamine (ADDERALL) 15 MG tablet  Refilled her adderall 15 BID for 3 months . She may call for another 3 rx in 3 months.  Then recheck in clinic in 6 months.  Let me know if any problems in the meantime.    Signed Abbe Amsterdam, MD

## 2014-02-18 ENCOUNTER — Other Ambulatory Visit: Payer: Self-pay | Admitting: Physician Assistant

## 2014-03-02 ENCOUNTER — Ambulatory Visit (INDEPENDENT_AMBULATORY_CARE_PROVIDER_SITE_OTHER): Payer: BC Managed Care – PPO | Admitting: Family Medicine

## 2014-03-02 ENCOUNTER — Ambulatory Visit: Payer: BC Managed Care – PPO

## 2014-03-02 VITALS — BP 109/76 | HR 90 | Temp 98.2°F | Resp 16 | Ht 66.0 in | Wt 128.0 lb

## 2014-03-02 DIAGNOSIS — F988 Other specified behavioral and emotional disorders with onset usually occurring in childhood and adolescence: Secondary | ICD-10-CM

## 2014-03-02 DIAGNOSIS — K219 Gastro-esophageal reflux disease without esophagitis: Secondary | ICD-10-CM

## 2014-03-02 DIAGNOSIS — R55 Syncope and collapse: Secondary | ICD-10-CM

## 2014-03-02 DIAGNOSIS — R079 Chest pain, unspecified: Secondary | ICD-10-CM

## 2014-03-02 LAB — COMPREHENSIVE METABOLIC PANEL
ALBUMIN: 3.8 g/dL (ref 3.5–5.2)
ALT: 8 U/L (ref 0–35)
AST: 15 U/L (ref 0–37)
Alkaline Phosphatase: 67 U/L (ref 39–117)
BUN: 9 mg/dL (ref 6–23)
CHLORIDE: 102 meq/L (ref 96–112)
CO2: 32 mEq/L (ref 19–32)
Calcium: 9 mg/dL (ref 8.4–10.5)
Creat: 0.75 mg/dL (ref 0.50–1.10)
Glucose, Bld: 65 mg/dL — ABNORMAL LOW (ref 70–99)
POTASSIUM: 3.6 meq/L (ref 3.5–5.3)
Sodium: 139 mEq/L (ref 135–145)
Total Bilirubin: 0.3 mg/dL (ref 0.2–1.2)
Total Protein: 7.5 g/dL (ref 6.0–8.3)

## 2014-03-02 LAB — POCT CBC
Granulocyte percent: 63.8 %G (ref 37–80)
HEMATOCRIT: 40 % (ref 37.7–47.9)
HEMOGLOBIN: 12.9 g/dL (ref 12.2–16.2)
LYMPH, POC: 2.9 (ref 0.6–3.4)
MCH, POC: 28.2 pg (ref 27–31.2)
MCHC: 32.3 g/dL (ref 31.8–35.4)
MCV: 87.5 fL (ref 80–97)
MID (CBC): 0.5 (ref 0–0.9)
MPV: 8.6 fL (ref 0–99.8)
POC Granulocyte: 6.1 (ref 2–6.9)
POC LYMPH PERCENT: 31 %L (ref 10–50)
POC MID %: 5.2 %M (ref 0–12)
Platelet Count, POC: 349 10*3/uL (ref 142–424)
RBC: 4.57 M/uL (ref 4.04–5.48)
RDW, POC: 14.1 %
WBC: 9.5 10*3/uL (ref 4.6–10.2)

## 2014-03-02 LAB — GLUCOSE, POCT (MANUAL RESULT ENTRY)
POC GLUCOSE: 54 mg/dL — AB (ref 70–99)
POC Glucose: 82 mg/dl (ref 70–99)

## 2014-03-02 LAB — POCT URINE PREGNANCY: PREG TEST UR: NEGATIVE

## 2014-03-02 MED ORDER — AMPHETAMINE-DEXTROAMPHETAMINE 15 MG PO TABS: 15.0000 mg | ORAL_TABLET | Freq: Two times a day (BID) | ORAL | Status: AC

## 2014-03-02 MED ORDER — SUCRALFATE 1 G PO TABS: 1.0000 g | ORAL_TABLET | Freq: Three times a day (TID) | ORAL | Status: DC

## 2014-03-02 MED ORDER — AMPHETAMINE-DEXTROAMPHET ER 15 MG PO CP24: 15.0000 mg | ORAL_CAPSULE | Freq: Two times a day (BID) | ORAL | Status: DC

## 2014-03-02 MED ORDER — OMEPRAZOLE 20 MG PO CPDR: 20.0000 mg | DELAYED_RELEASE_CAPSULE | Freq: Every day | ORAL | Status: DC

## 2014-03-02 NOTE — Patient Instructions (Addendum)
Your fainting episode was probably due to low blood pressure. When you stand up, go slowly and sit back down if you feel faint.  Try to drink more fluids and take in more salt; soups and sports drinks during the day may be helpful.  Also be sure to eat frequent snacks to keep your blood sugar up.  Use the medications for your stomach pain- carafate and prilosec- as directed.  If your chest pains change- become more persistent or last longer, or are associated with exercise seek help right away.    I am going to refer you to cardiology for further evaluation.  If you have any other unusual episodes please seek help right away.   I would recommend that your not use your adderall until we make sure all is ok with your heart.

## 2014-03-02 NOTE — Progress Notes (Signed)
[facility] [address] [place] [address] [phone]  Date:  03/02/2014   Name:  Michele Wise   DOB:  11/06/1986   MRN:  [id number]  PCP:  No PCP Per Patient    Chief Complaint: Chest Pain and Medication Refill   History of Present Illness:  Michele Wise is a 28 y.o. very pleasant female patient who presents with the following:  Here today to discuss some concerns. "I'm falling apart, basically."   She fainted 2 weeks ago- when she fell she broke a tooth, cut her lip, and bruised her arm. She did not seek medical attention except for seeing her dentist to have her tooth repaired.    She was at [employer]- she was sitting on the toilet and stood up, she then had LOC. She was "out of it" but not actually unconscious for 2 hours or so. She cannot remember too much about this time period, so she is not really sure how long she was unconscious.  She was with some friends who kept her awake and watched her.  No seizure activity, no incontinence.    She did faint a couple of years ago; the same sort of thing happened when she stood up.    She now also has c/o substernal CP.  She notes sharp, intermittent pain under her breastbone.  The pain may last for a couple of seconds.  Feels "stabbing, tight."  She has not noted any relationship to activity or exercise.  She does work- out a few times a week and has not noted any CP while exercising.  She thought this CP was likely due to GERD.  She has NO CP now whatsoever.  She did have the pain earlier this am- none now.    She does note SOB "kind of" when she has the acute pain.    No fever, chills, N/V.    She did drive here today  LMP about 3 weeks ago.    Wt Readings from Last 3 Encounters:  03/02/14 128 lb (58.06 kg)  12/04/13 138 lb (62.596 kg)  11/18/13 129 lb (58.514 kg)   Asked her; we suspect the weight of 138 was a typo.   There are no active problems to display for this patient.   History reviewed. No  pertinent past medical history.  History reviewed. No pertinent past surgical history.  History  Substance Use Topics  . Smoking status: Former Smoker -- 3 years    Types: Cigarettes    Quit date: 10/18/2009  . Smokeless tobacco: Not on file     Comment: smoked 5 cigarettes a week  . Alcohol Use: Yes     Comment: mixed drinks    Family History  Problem Relation Age of Onset  . Hyperlipidemia Mother   . Hyperlipidemia Father   . Heart disease Maternal Grandmother     heart attack, pacemaker  . Cancer Maternal Grandfather 27    colon cancer    No Known Allergies  Medication list has been reviewed and updated.  Current Outpatient Prescriptions on File Prior to Visit  Medication Sig Dispense Refill  . amphetamine-dextroamphetamine (ADDERALL) 15 MG tablet Take 1 tablet (15 mg total) by mouth 2 (two) times daily.  60 tablet  0  . amphetamine-dextroamphetamine (ADDERALL) 15 MG tablet Take 1 tablet (15 mg total) by mouth 2 (two) times daily.  60 tablet  0  . RECLIPSEN 0.15-30 MG-MCG tablet TAKE ONE TABLET BY MOUTH DAILY  3 Package  4  . valACYclovir (VALTREX) 1000 MG tablet Take 1 tablet (1,000 mg total) by mouth daily. TAKE 2 TABLETS BY MOUTH THEN REPEAT DOSE IN 12 HOURS FOR EACH OUTBREAK (4 TABLETS PER OUTBREAK)  90 tablet  3   No current facility-administered medications on file prior to visit.    Review of Systems:  As per HPI- otherwise negative.   Physical Examination: Filed Vitals:   03/02/14 1515  BP: 102/68  Pulse: 102  Temp: 98.2 F (36.8 C)  Resp: 16   Filed Vitals:   03/02/14 1515  Height: 5\' 6"  (1.676 m)  Weight: 128 lb (58.06 kg)   Body mass index is 20.67 kg/(m^2). Ideal Body Weight: Weight in (lb) to have BMI = 25: 154.6  GEN: WDWN, NAD, Non-toxic, A & O x 3, looks well, slim build HEENT: Atraumatic, Normocephalic. Neck supple. No masses, No LAD.  Bilateral TM wnl, oropharynx normal.  PEERL,EOMI.   Ears and Nose: No external deformity. CV: RRR,  No M/G/R. No JVD. No thrill. No extra heart sounds. PULM: CTA B, no wheezes, crackles, rhonchi. No retractions. No resp. distress. No accessory muscle use. ABD: S, NT, ND, +BS. No rebound. No HSM. EXTR: No c/c/e NEURO Normal gait. Normal strength, sensation and DTR all extremities.   PSYCH: Normally interactive. Conversant. Not depressed or anxious appearing.  Calm demeanor.   EKG: NSR with rate of 72 B PM, no ST elevation or depression  [facility] reading (PRIMARY) by  Dr. Patsy Lageropland. CXR: negative  CHEST 2 VIEW  COMPARISON: None  FINDINGS: Normal heart size, mediastinal contours, and pulmonary vascularity.  Lungs clear.  No pleural effusion or pneumothorax.  Bones unremarkable.  IMPRESSION: No acute abnormalities.   Results for orders placed in visit on 03/02/14  POCT CBC      Result Value Ref Range   WBC 9.5  4.6 - 10.2 K/uL   Lymph, poc 2.9  0.6 - 3.4   POC LYMPH PERCENT 31.0  10 - 50 %L   MID (cbc) 0.5  0 - 0.9   POC MID % 5.2  0 - 12 %M   POC Granulocyte 6.1  2 - 6.9   Granulocyte percent 63.8  37 - 80 %G   RBC 4.57  4.04 - 5.48 M/uL   Hemoglobin 12.9  12.2 - 16.2 g/dL   HCT, POC 16.140.0  09.637.7 - 47.9 %   MCV 87.5  80 - 97 fL   MCH, POC 28.2  27 - 31.2 pg   MCHC 32.3  31.8 - 35.4 g/dL   RDW, POC 04.514.1     Platelet Count, POC 349  142 - 424 K/uL   MPV 8.6  0 - 99.8 fL  GLUCOSE, POCT (MANUAL RESULT ENTRY)      Result Value Ref Range   POC Glucose 54 (*) 70 - 99 mg/dl  POCT URINE PREGNANCY      Result Value Ref Range   Preg Test, Ur Negative    GLUCOSE, POCT (MANUAL RESULT ENTRY)      Result Value Ref Range   POC Glucose 82  70 - 99 mg/dl   Gave her crackers and juice and rechecked her glucose as above.  Orthostatic BP as per VS.  negative Assessment and Plan: Chest pain - Plan: DG Chest 2 View, EKG 12-Lead, Ambulatory referral to Cardiology, POCT glucose (manual entry)  Syncope - Plan: POCT CBC, POCT glucose (manual entry), POCT urine pregnancy, TSH, Comprehensive  metabolic panel, Ambulatory referral  to Cardiology, POCT glucose (manual entry)  ADD (attention deficit disorder) - Plan: amphetamine-dextroamphetamine (ADDERALL) 15 MG tablet, amphetamine-dextroamphetamine (ADDERALL) 15 MG tablet, amphetamine-dextroamphetamine (ADDERALL) 15 MG tablet, DISCONTINUED: amphetamine-dextroamphetamine (ADDERALL XR) 15 MG 24 hr capsule  GERD (gastroesophageal reflux disease) - Plan: sucralfate (CARAFATE) 1 G tablet, omeprazole (PRILOSEC) 20 MG capsule  Mikaela is here today with a couple of concerns.  2 weeks ago she had what sounds like a vasovagal syncopal episode.  She has a slight build, seems to be prone to hypotension and hypoglycemia.  Encouraged increased fluid and salt intake and frequent snacks.  Will refer to cardiology in case a holter or other eval may be warranted.  If this recurs she will seek care.  TSH, CMP pending  Chest pain: suspect due to GERD.  She has no CP now and a normal EKG and CXR.  However explained that I cannot totally rule out cardiac problems here and offered to have her seen at the ED.  She declines this for now, she also thinks she has GERD. Will try carafate and prilosec.  If she has changes in her sx or any more severe sx she will seek care right away  Signed Abbe Amsterdam, MD

## 2014-03-03 ENCOUNTER — Encounter: Payer: Self-pay | Admitting: Family Medicine

## 2014-03-03 LAB — TSH: TSH: 1.139 u[IU]/mL (ref 0.350–4.500)

## 2014-03-17 ENCOUNTER — Institutional Professional Consult (permissible substitution): Payer: Self-pay | Admitting: Cardiology

## 2014-03-23 ENCOUNTER — Ambulatory Visit (INDEPENDENT_AMBULATORY_CARE_PROVIDER_SITE_OTHER): Payer: BC Managed Care – PPO | Admitting: Family Medicine

## 2014-03-23 VITALS — BP 102/64 | HR 89 | Temp 98.3°F | Resp 18 | Ht 66.0 in | Wt 128.0 lb

## 2014-03-23 DIAGNOSIS — E162 Hypoglycemia, unspecified: Secondary | ICD-10-CM

## 2014-03-23 DIAGNOSIS — R110 Nausea: Secondary | ICD-10-CM

## 2014-03-23 DIAGNOSIS — R51 Headache: Secondary | ICD-10-CM

## 2014-03-23 LAB — GLUCOSE, POCT (MANUAL RESULT ENTRY): POC Glucose: 94 mg/dl (ref 70–99)

## 2014-03-23 MED ORDER — RIZATRIPTAN BENZOATE 10 MG PO TABS: 10.0000 mg | ORAL_TABLET | ORAL | Status: DC | PRN

## 2014-03-23 MED ORDER — PROMETHAZINE HCL 25 MG PO TABS: 25.0000 mg | ORAL_TABLET | Freq: Four times a day (QID) | ORAL | Status: DC | PRN

## 2014-03-23 NOTE — Patient Instructions (Signed)
Hypoglycemia (Low Blood Sugar) Hypoglycemia is when the glucose (sugar) in your blood is too low. Hypoglycemia can happen for many reasons. It can happen to people with or without diabetes. Hypoglycemia can develop quickly and can be a medical emergency.  CAUSES  Having hypoglycemia does not mean that you will develop diabetes. Different causes include:  Missed or delayed meals or not enough carbohydrates eaten.  Medication overdose. This could be by accident or deliberate. If by accident, your medication may need to be adjusted or changed.  Exercise or increased activity without adjustments in carbohydrates or medications.  A nerve disorder that affects body functions like your heart rate, blood pressure and digestion (autonomic neuropathy).  A condition where the stomach muscles do not function properly (gastroparesis). Therefore, medications may not absorb properly.  The inability to recognize the signs of hypoglycemia (hypoglycemic unawareness).  Absorption of insulin  may be altered.  Alcohol consumption.  Pregnancy/menstrual cycles/postpartum. This may be due to hormones.  Certain kinds of tumors. This is very rare. SYMPTOMS   Sweating.  Hunger.  Dizziness.  Blurred vision.  Drowsiness.  Weakness.  Headache.  Rapid heart beat.  Shakiness.  Nervousness. DIAGNOSIS  Diagnosis is made by monitoring blood glucose in one or all of the following ways:  Fingerstick blood glucose monitoring.  Laboratory results. TREATMENT  If you think your blood glucose is low:  Check your blood glucose, if possible. If it is less than 70 mg/dl, take one of the following:  3-4 glucose tablets.   cup juice (prefer clear like apple).   cup "regular" soda pop.  1 cup milk.  -1 tube of glucose gel.  5-6 hard candies.  Do not over treat because your blood glucose (sugar) will only go too high.  Wait 15 minutes and recheck your blood glucose. If it is still less than  70 mg/dl (or below your target range), repeat treatment.  Eat a snack if it is more than one hour until your next meal. Sometimes, your blood glucose may go so low that you are unable to treat yourself. You may need someone to help you. You may even pass out or be unable to swallow. This may require you to get an injection of glucagon, which raises the blood glucose. HOME CARE INSTRUCTIONS  Check blood glucose as recommended by your caregiver.  Take medication as prescribed by your caregiver.  Follow your meal plan. Do not skip meals. Eat on time.  If you are going to drink alcohol, drink it only with meals.  Check your blood glucose before driving.  Check your blood glucose before and after exercise. If you exercise longer or different than usual, be sure to check blood glucose more frequently.  Always carry treatment with you. Glucose tablets are the easiest to carry.  Always wear medical alert jewelry or carry some form of identification that states that you have diabetes. This will alert people that you have diabetes. If you have hypoglycemia, they will have a better idea on what to do. SEEK MEDICAL CARE IF:   You are having problems keeping your blood sugar at target range.  You are having frequent episodes of hypoglycemia.  You feel you might be having side effects from your medicines.  You have symptoms of an illness that is not improving after 3-4 days.  You notice a change in vision or a new problem with your vision. SEEK IMMEDIATE MEDICAL CARE IF:   You are a family member or friend of a   person whose blood glucose goes below 70 mg/dl and is accompanied by:  Confusion.  A change in mental status.  The inability to swallow.  Passing out. Document Released: 12/04/2005 Document Revised: 02/26/2012 Document Reviewed: 04/01/2012 [facility] Patient Information 2014 [place] PlaceExitCare, [place]LL[place]. Migraine Headache A migraine headache is an intense, throbbing pain on one or both sides  of your head. A migraine can last for 30 minutes to several hours. CAUSES  The exact cause of a migraine headache is not always known. However, a migraine may be caused when nerves in the brain become irritated and release chemicals that cause inflammation. This causes pain. Certain things may also trigger migraines, such as:  Alcohol.  Smoking.  Stress.  Menstruation.  Aged cheeses.  Foods or drinks that contain nitrates, glutamate, aspartame, or tyramine.  Lack of sleep.  Chocolate.  Caffeine.  Hunger.  Physical exertion.  Fatigue.  Medicines used to treat chest pain (nitroglycerine), birth control pills, estrogen, and some blood pressure medicines. SIGNS AND SYMPTOMS  Pain on one or both sides of your head.  Pulsating or throbbing pain.  Severe pain that prevents daily activities.  Pain that is aggravated by any physical activity.  Nausea, vomiting, or both.  Dizziness.  Pain with exposure to bright lights, loud noises, or activity.  General sensitivity to bright lights, loud noises, or smells. Before you get a migraine, you may get warning signs that a migraine is coming (aura). An aura may include:  Seeing flashing lights.  Seeing bright spots, halos, or zig-zag lines.  Having tunnel vision or blurred vision.  Having feelings of numbness or tingling.  Having trouble talking.  Having muscle weakness. DIAGNOSIS  A migraine headache is often diagnosed based on:  Symptoms.  Physical exam.  A CT scan or MRI of your head. These imaging tests cannot diagnose migraines, but they can help rule out other causes of headaches. TREATMENT Medicines may be given for pain and nausea. Medicines can also be given to help prevent recurrent migraines.  HOME CARE INSTRUCTIONS  Only take over-the-counter or prescription medicines for pain or discomfort as directed by your health care provider. The use of long-term narcotics is not recommended.  Lie down in a  dark, quiet room when you have a migraine.  Keep a journal to find out what may trigger your migraine headaches. For example, write down:  What you eat and drink.  How much sleep you get.  Any change to your diet or medicines.  Limit alcohol consumption.  Quit smoking if you smoke.  Get 7 9 hours of sleep, or as recommended by your health care provider.  Limit stress.  Keep lights dim if bright lights bother you and make your migraines worse. SEEK IMMEDIATE MEDICAL CARE IF:   Your migraine becomes severe.  You have a fever.  You have a stiff neck.  You have vision loss.  You have muscular weakness or loss of muscle control.  You start losing your balance or have trouble walking.  You feel faint or pass out.  You have severe symptoms that are different from your first symptoms. MAKE SURE YOU:   Understand these instructions.  Will watch your condition.  Will get help right away if you are not doing well or get worse. Document Released: 12/04/2005 Document Revised: 09/24/2013 Document Reviewed: 08/11/2013 [facility] Patient Information 2014 [place] HeavenExitCare, [place]LL[place].

## 2014-03-23 NOTE — Progress Notes (Signed)
 Chief Complaint:  Chief Complaint  Patient presents with  . Headache    today  . Nausea    HPI: Michele Wise is a 28 y.o. female who is here for  Usual migraine HA with nausea.  Possible HAs that start from occiput, She felt very dizzy, she feels like she has been having HA, blurred viison, nausea. These are typial sxs for her She has had MRI done before and was normal She has had migraines for years and thinks maybe associated with hypoglycemia, last time she had low sugars. She also doesnot have good sleep hygeine She was on meds for HA in the past, one of them was a narcotic and that helped. She has had phenergan but ran out. She does nto want an injection of toradol She has taken ibuprofen without relief. She is not pregnant.  She eats snacks throughout the day since she was told she had hypoglycemia on her last visit.  She is on Adderall for ADD and states she has had an appetite and weight has not fluctuated, she eats nl She has been about 58 kg for sometime now.   History reviewed. No pertinent past medical history. History reviewed. No pertinent past surgical history. History   Social History  . Marital Status: Single    Spouse Name: N/A    Number of Children: N/A  . Years of Education: N/A   Social History Main Topics  . Smoking status: Former Smoker -- 3 years    Types: Cigarettes    Quit date: 10/18/2009  . Smokeless tobacco: None     Comment: smoked 5 cigarettes a week  . Alcohol Use: Yes     Comment: mixed drinks  . Drug Use: None  . Sexual Activity: None   Other Topics Concern  . None   Social History Narrative  . None   Family History  Problem Relation Age of Onset  . Hyperlipidemia Mother   . Hyperlipidemia Father   . Heart disease Maternal Grandmother     heart attack, pacemaker  . Cancer Maternal Grandfather 13    colon cancer   No Known Allergies Prior to Admission medications   Medication Sig Start Date End Date Taking? Authorizing  Provider  amphetamine-dextroamphetamine (ADDERALL) 15 MG tablet Take 1 tablet (15 mg total) by mouth 2 (two) times daily. 03/02/14  Yes Gwenlyn Found Copland, MD  amphetamine-dextroamphetamine (ADDERALL) 15 MG tablet Take 1 tablet (15 mg total) by mouth 2 (two) times daily. 03/02/14  Yes Gwenlyn Found Copland, MD  amphetamine-dextroamphetamine (ADDERALL) 15 MG tablet Take 1 tablet (15 mg total) by mouth 2 (two) times daily. May fill on 04/29/2014 03/02/14  Yes Gwenlyn Found Copland, MD  omeprazole (PRILOSEC) 20 MG capsule Take 1 capsule (20 mg total) by mouth daily. 03/02/14  Yes Pearline Cables, MD  RECLIPSEN 0.15-30 MG-MCG tablet TAKE ONE TABLET BY MOUTH DAILY 08/03/13  Yes Eleanore Delia Chimes, PA-C  sucralfate (CARAFATE) 1 G tablet Take 1 tablet (1 g total) by mouth 4 (four) times daily -  with meals and at bedtime. 03/02/14  Yes Gwenlyn Found Copland, MD  valACYclovir (VALTREX) 1000 MG tablet Take 1 tablet (1,000 mg total) by mouth daily. TAKE 2 TABLETS BY MOUTH THEN REPEAT DOSE IN 12 HOURS FOR EACH OUTBREAK (4 TABLETS PER OUTBREAK) 07/05/12  Yes Marzella Schlein McClung, PA-C     ROS: The patient denies fevers, chills, night sweats, unintentional weight loss, chest pain, palpitations, wheezing, dyspnea on exertion,  nausea, vomiting, abdominal pain, dysuria, hematuria, melena, numbness, weakness, or tingling.  All other systems have been reviewed and were otherwise negative with the exception of those mentioned in the HPI and as above.    PHYSICAL EXAM: Filed Vitals:   03/23/14 2024  BP: 102/64  Pulse: 89  Temp: 98.3 F (36.8 C)  Resp: 18  Spo2 100% Filed Vitals:   03/23/14 2024  Height: 5\' 6"  (1.676 m)  Weight: 128 lb (58.06 kg)   Body mass index is 20.67 kg/(m^2).  General: Alert, no acute distress HEENT:  Normocephalic, atraumatic, oropharynx patent. EOMI, PERRLA, fundo exam nl, tm nl Cardiovascular:  Regular rate and rhythm, no rubs murmurs or gallops.  No Carotid bruits, radial pulse intact. No pedal edema.    Respiratory: Clear to auscultation bilaterally.  No wheezes, rales, or rhonchi.  No cyanosis, no use of accessory musculature GI: No organomegaly, abdomen is soft and non-tender, positive bowel sounds.  No masses. Skin: No rashes. Neurologic: Facial musculature symmetric. CN 2-12 grossly nl Psychiatric: Patient is appropriate throughout our interaction. Lymphatic: No cervical lymphadenopathy Musculoskeletal: Gait intact. 5/5 strength sensation intact   LABS: Results for orders placed in visit on 03/23/14  GLUCOSE, POCT (MANUAL RESULT ENTRY)      Result Value Ref Range   POC Glucose 94  70 - 99 mg/dl     EKG/XRAY:   [facility]   ASSESSMENT/PLAN: Encounter Diagnoses  Name Primary?  . Hypoglycemia Yes  . Headache(784.0)   . Nausea alone    28 y/o with h/o migraine HA Rx phenegan for nausea and maxalt trial for HA She denies being on triptans before I encouraged her not to use any narcotics since she can get rebound HAs, also improve sleep hygiene IF maxalt does not help we can try other triptans Encourage small meals throughout day, packed in protein and complex sugars Work note given F/u prn   Gross sideeffects, risk and benefits, and alternatives of medications d/w patient. Patient is aware that all medications have potential sideeffects and we are unable to predict every sideeffect or drug-drug interaction that may occur.  Hamilton CapriLE,  PHUONG, DO 03/23/2014 9:50 PM

## 2015-08-05 ENCOUNTER — Other Ambulatory Visit (HOSPITAL_COMMUNITY)
Admission: RE | Admit: 2015-08-05 | Discharge: 2015-08-05 | Disposition: A | Discharge status: Home / Self Care | Payer: No Typology Code available for payment source | Source: Ambulatory Visit | Attending: Family Medicine | Admitting: Family Medicine

## 2015-08-05 ENCOUNTER — Other Ambulatory Visit: Payer: Self-pay | Admitting: Family Medicine

## 2015-08-05 DIAGNOSIS — Z1151 Encounter for screening for human papillomavirus (HPV): Secondary | ICD-10-CM | POA: Insufficient documentation

## 2015-08-05 DIAGNOSIS — Z113 Encounter for screening for infections with a predominantly sexual mode of transmission: Secondary | ICD-10-CM | POA: Diagnosis present

## 2015-08-05 DIAGNOSIS — Z124 Encounter for screening for malignant neoplasm of cervix: Secondary | ICD-10-CM | POA: Insufficient documentation

## 2015-08-09 LAB — CYTOLOGY - PAP

## 2015-09-24 ENCOUNTER — Other Ambulatory Visit: Payer: Self-pay | Admitting: Family Medicine

## 2015-09-24 ENCOUNTER — Ambulatory Visit
Admission: RE | Admit: 2015-09-24 | Discharge: 2015-09-24 | Disposition: A | Discharge status: Home / Self Care | Payer: BLUE CROSS/BLUE SHIELD | Source: Ambulatory Visit | Attending: Family Medicine | Admitting: Family Medicine

## 2015-09-24 DIAGNOSIS — M542 Cervicalgia: Secondary | ICD-10-CM

## 2015-11-05 ENCOUNTER — Ambulatory Visit (INDEPENDENT_AMBULATORY_CARE_PROVIDER_SITE_OTHER): Payer: BLUE CROSS/BLUE SHIELD | Admitting: Physician Assistant

## 2015-11-05 VITALS — BP 108/72 | HR 91 | Temp 98.0°F | Resp 16 | Ht 66.0 in | Wt 141.6 lb

## 2015-11-05 DIAGNOSIS — M5030 Other cervical disc degeneration, unspecified cervical region: Secondary | ICD-10-CM

## 2015-11-05 DIAGNOSIS — M542 Cervicalgia: Secondary | ICD-10-CM

## 2015-11-05 DIAGNOSIS — L309 Dermatitis, unspecified: Secondary | ICD-10-CM

## 2015-11-05 DIAGNOSIS — R238 Other skin changes: Secondary | ICD-10-CM | POA: Diagnosis not present

## 2015-11-05 DIAGNOSIS — L659 Nonscarring hair loss, unspecified: Secondary | ICD-10-CM

## 2015-11-05 DIAGNOSIS — R233 Spontaneous ecchymoses: Secondary | ICD-10-CM

## 2015-11-05 NOTE — Patient Instructions (Addendum)
Drink at least 64 ounces of water daily. Consider a humidifier for the room where you sleep. Bathe once daily. Avoid using HOT water, as it dries skin. Avoid deodorant soaps (Dial is the worst!) and stick with gentle cleansers (I like Cetaphil Liquid Cleanser). After bathing, dry off completely, then apply a thick emollient cream (I like Cetaphil Moisturizing Cream). Apply the cream twice daily, or more!  When washing your hands, use cooler water and make sure you dry them COMPLETELY. Apply an emollient after every time you wash your hands. Aquaphor is a great choice, and comes in small sizes so can put it in your purse or bag.  Talk with your PCP about the hair loss, easy bruising and the physical therapy referral. Advocate for yourself!
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
## 2015-11-08 NOTE — Progress Notes (Signed)
  Medical screening examination/treatment/procedure(s) were performed by non-physician practitioner and as supervising physician I was immediately available for consultation/collaboration.     

## 2015-11-22 ENCOUNTER — Ambulatory Visit: Payer: BLUE CROSS/BLUE SHIELD | Admitting: Rehabilitation
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
## 2017-10-09 ENCOUNTER — Ambulatory Visit: Payer: BLUE CROSS/BLUE SHIELD | Admitting: Physician Assistant

## 2017-12-13 ENCOUNTER — Telehealth: Payer: Self-pay | Admitting: Physical Therapy

## 2017-12-13 NOTE — Telephone Encounter (Signed)
12/06/17 spoke with patient due to deductible not being met, gave her phone number for [facility] location to call to schedule

## 2018-07-08 ENCOUNTER — Other Ambulatory Visit: Payer: Self-pay

## 2018-07-08 ENCOUNTER — Ambulatory Visit (INDEPENDENT_AMBULATORY_CARE_PROVIDER_SITE_OTHER): Payer: Managed Care, Other (non HMO) | Admitting: Urgent Care

## 2018-07-08 ENCOUNTER — Encounter: Payer: Self-pay | Admitting: Urgent Care

## 2018-07-08 VITALS — BP 127/87 | HR 88 | Temp 98.0°F | Ht 67.0 in | Wt 191.0 lb

## 2018-07-08 DIAGNOSIS — R112 Nausea with vomiting, unspecified: Secondary | ICD-10-CM

## 2018-07-08 DIAGNOSIS — R05 Cough: Secondary | ICD-10-CM | POA: Diagnosis not present

## 2018-07-08 DIAGNOSIS — R059 Cough, unspecified: Secondary | ICD-10-CM

## 2018-07-08 DIAGNOSIS — R070 Pain in throat: Secondary | ICD-10-CM | POA: Diagnosis not present

## 2018-07-08 DIAGNOSIS — J069 Acute upper respiratory infection, unspecified: Secondary | ICD-10-CM

## 2018-07-08 LAB — POCT URINE PREGNANCY: Preg Test, Ur: NEGATIVE

## 2018-07-08 LAB — POCT RAPID STREP A (OFFICE): Rapid Strep A Screen: NEGATIVE

## 2018-07-08 MED ORDER — HYDROCODONE-HOMATROPINE 5-1.5 MG/5ML PO SYRP: 5.0000 mL | ORAL_SOLUTION | Freq: Every evening | ORAL | 0 refills | Status: AC | PRN

## 2018-07-08 NOTE — Progress Notes (Signed)
    MRN: [id number] DOB: 12/01/1986  Subjective:   Michele Wise is a 32 y.o. female presenting for 3 day history of left sided throat pain, painful swallowing, scratchy throat, nasal congestion, headache initially (now resolved), dry cough. Had 1 episode of nausea with vomiting 3 days ago. Has also had ~2-3 loose stools, improving today. Has tried APAP cold/flu. Denies fever, sinus pain, ear pain, ear drainage, chest pain, belly pain, bloody stools. Denies smoking cigarettes. Has occasional alcohol drink once per month. She is not currently on Adderall.   Michele Wise has a current medication list which includes the following prescription(s): amphetamine-dextroamphetamine, amphetamine-dextroamphetamine, amphetamine-dextroamphetamine, vitamin d3, and reclipsen. Also has No Known Allergies.  Michele Wise ADD. Has had spine surgery for pinched nerve.   Objective:   Vitals: BP 127/87 (BP Location: Left Arm, Patient Position: Sitting, Cuff Size: Normal)   Pulse 88   Temp 98 F (36.7 C) (Oral)   Ht 5\' 7"  (1.702 m)   Wt 191 lb (86.6 kg)   SpO2 98%   BMI 29.91 kg/m   Physical Exam  Constitutional: She is oriented to person, place, and time. She appears well-developed and well-nourished.  HENT:  TMs intact bilaterally.  Nasal passages laterally patent, mucosal edema with slight rhinorrhea.  Significant postnasal drainage.  No tonsillar erythema or exudates.  Eyes: Right eye exhibits no discharge. Left eye exhibits no discharge. No scleral icterus.  Neck: Normal range of motion. Neck supple.  Left-sided cervical lymph node pain without lymphadenopathy.  Cardiovascular: Normal rate, regular rhythm and intact distal pulses. Exam reveals no gallop and no friction rub.  No murmur heard. Pulmonary/Chest: Effort normal and breath sounds normal. No stridor. No respiratory distress. She has no wheezes. She has no rales.  Abdominal: Soft. Bowel sounds are normal. She exhibits no distension and no mass. There is no  tenderness. There is no rebound and no guarding.  Lymphadenopathy:    She has no cervical adenopathy.  Neurological: She is alert and oriented to person, place, and time.  Skin: Skin is warm and dry.  Psychiatric: She has a normal mood and affect.   Results for orders placed or performed in visit on 07/08/18 (from the past 24 hour(s))  POCT rapid strep A     Status: None   Collection Time: 07/08/18  5:58 PM  Result Value Ref Range   Rapid Strep A Screen Negative Negative  POCT urine pregnancy     Status: None   Collection Time: 07/08/18  5:58 PM  Result Value Ref Range   Preg Test, Ur Negative Negative   Assessment and Plan :   Viral URI  Throat pain - Plan: POCT rapid strep A, Culture, Group A Strep  Cough  Nausea and vomiting, intractability of vomiting not specified, unspecified vomiting type - Plan: POCT urine pregnancy  Start supportive care for viral type illness.  Labs pending. Counseled patient on potential for adverse effects with medications prescribed today, patient verbalized understanding. Return-to-clinic precautions discussed, patient verbalized understanding.   Wallis BambergMario Jearl Soto, PA-C [facility] [phone] 07/08/2018  5:42 PM

## 2018-07-08 NOTE — Patient Instructions (Addendum)
Hydrate well with at least 2 liters (1 gallon) of water daily. For sore throat try using a honey-based tea. Use 3 teaspoons of honey with juice squeezed from half lemon. Place shaved pieces of ginger into 1/2-1 cup of water and warm over stove top. Then mix the ingredients and repeat every 4 hours as needed. You may take 500mg Tylenol with ibuprofen 400-600mg every 6 hours for pain and inflammation.     Viral Respiratory Infection A respiratory infection is an illness that affects part of the respiratory system, such as the lungs, nose, or throat. Most respiratory infections are caused by either viruses or bacteria. A respiratory infection that is caused by a virus is called a viral respiratory infection. Common types of viral respiratory infections include:  A cold.  The flu (influenza).  A respiratory syncytial virus (RSV) infection.  How do I know if I have a viral respiratory infection? Most viral respiratory infections cause:  A stuffy or runny nose.  Yellow or green nasal discharge.  A cough.  Sneezing.  Fatigue.  Achy muscles.  A sore throat.  Sweating or chills.  A fever.  A headache.  How are viral respiratory infections treated? If influenza is diagnosed early, it may be treated with an antiviral medicine that shortens the length of time a person has symptoms. Symptoms of viral respiratory infections may be treated with over-the-counter and prescription medicines, such as:  Expectorants. These make it easier to cough up mucus.  Decongestant nasal sprays.  Health care providers do not prescribe antibiotic medicines for viral infections. This is because antibiotics are designed to kill bacteria. They have no effect on viruses. How do I know if I should stay home from work or school? To avoid exposing others to your respiratory infection, stay home if you have:  A fever.  A persistent cough.  A sore throat.  A runny nose.  Sneezing.  Muscles  aches.  Headaches.  Fatigue.  Weakness.  Chills.  Sweating.  Nausea.  Follow these instructions at home:  Rest as much as possible.  Take over-the-counter and prescription medicines only as told by your health care provider.  Drink enough fluid to keep your urine clear or pale yellow. This helps prevent dehydration and helps loosen up mucus.  Gargle with a salt-water mixture 3-4 times per day or as needed. To make a salt-water mixture, completely dissolve -1 tsp of salt in 1 cup of warm water.  Use nose drops made from salt water to ease congestion and soften raw skin around your nose.  Do not drink alcohol.  Do not use tobacco products, including cigarettes, chewing tobacco, and e-cigarettes. If you need help quitting, ask your health care provider. Contact a health care provider if:  Your symptoms last for 10 days or longer.  Your symptoms get worse over time.  You have a fever.  You have severe sinus pain in your face or forehead.  The glands in your jaw or neck become very swollen. Get help right away if:  You feel pain or pressure in your chest.  You have shortness of breath.  You faint or feel like you will faint.  You have severe and persistent vomiting.  You feel confused or disoriented. This information is not intended to replace advice given to you by your health care provider. Make sure you discuss any questions you have with your health care provider. Document Released: 09/13/2005 Document Revised: 05/11/2016 Document Reviewed: 05/12/2015 Elsevier Interactive Patient Education  2018   [employer]vier [employer]     IF you received an x-ray today, you will receive an invoice from [facility]. Please contact [facility] at [phone] with questions or concerns regarding your invoice.   IF you received labwork today, you will receive an invoice from [facility]. Please contact [facility] at [phone] with questions or concerns regarding your  invoice.   Our billing staff will not be able to assist you with questions regarding bills from these companies.  You will be contacted with the lab results as soon as they are available. The fastest way to get your results is to activate your My Chart account. Instructions are located on the last page of this paperwork. If you have not heard from us regarding the results in 2 weeks, please contact this office.      

## 2018-07-11 ENCOUNTER — Telehealth: Payer: Self-pay | Admitting: Urgent Care

## 2018-07-11 LAB — CULTURE, GROUP A STREP

## 2018-07-11 NOTE — Telephone Encounter (Signed)
Copied from CRM [phone] [id number]. Topic: Quick Communication - See Telephone Encounter >> Jul 11, 2018  3:37 PM Lorrine KinMcGee, Twila Rappa B, NT wrote: CRM for notification. See Telephone encounter for: 07/11/18. Patient calling and would like someone to call her with her results. Patient can not access MyChart. If there is no answer, patient requests to leave a message.  CB#: [phone]

## 2018-07-15 MED ORDER — PENICILLIN V POTASSIUM 500 MG PO TABS: 500.0000 mg | ORAL_TABLET | Freq: Three times a day (TID) | ORAL | 0 refills | Status: AC

## 2018-07-15 NOTE — Telephone Encounter (Signed)
Urban GibsonMani,   She states she is feeling better but still having some symptoms and would like an antibiotic called in

## 2018-07-15 NOTE — Telephone Encounter (Signed)
Prescription sent in for penicillin to be taken 3 times daily with food.  This is a 10-day course.  Follow-up if symptoms persist.

## 2024-12-26 ENCOUNTER — Emergency Department (HOSPITAL_COMMUNITY)
Admission: EM | Admit: 2024-12-26 | Discharge: 2024-12-26 | Disposition: A | Discharge status: Home / Self Care | Attending: Emergency Medicine | Admitting: Emergency Medicine

## 2024-12-26 ENCOUNTER — Emergency Department (HOSPITAL_COMMUNITY)

## 2024-12-26 ENCOUNTER — Other Ambulatory Visit: Payer: Self-pay

## 2024-12-26 DIAGNOSIS — R1031 Right lower quadrant pain: Secondary | ICD-10-CM | POA: Diagnosis not present

## 2024-12-26 DIAGNOSIS — R1033 Periumbilical pain: Secondary | ICD-10-CM | POA: Insufficient documentation

## 2024-12-26 DIAGNOSIS — R55 Syncope and collapse: Secondary | ICD-10-CM | POA: Insufficient documentation

## 2024-12-26 DIAGNOSIS — R197 Diarrhea, unspecified: Secondary | ICD-10-CM | POA: Diagnosis not present

## 2024-12-26 DIAGNOSIS — R1011 Right upper quadrant pain: Secondary | ICD-10-CM | POA: Diagnosis present

## 2024-12-26 LAB — URINALYSIS, ROUTINE W REFLEX MICROSCOPIC
Bilirubin Urine: NEGATIVE
Glucose, UA: NEGATIVE mg/dL
Ketones, ur: NEGATIVE mg/dL
Leukocytes,Ua: NEGATIVE
Nitrite: NEGATIVE
Protein, ur: 30 mg/dL — AB
Specific Gravity, Urine: 1.014 (ref 1.005–1.030)
pH: 7 (ref 5.0–8.0)

## 2024-12-26 LAB — COMPREHENSIVE METABOLIC PANEL WITH GFR
ALT: <5 U/L (ref 0–44)
AST: 16 U/L (ref 15–41)
Albumin: 4.4 g/dL (ref 3.5–5.0)
Alkaline Phosphatase: 86 U/L (ref 38–126)
Anion gap: 12 (ref 5–15)
BUN: 10 mg/dL (ref 6–20)
CO2: 24 mmol/L (ref 22–32)
Calcium: 9.5 mg/dL (ref 8.9–10.3)
Chloride: 103 mmol/L (ref 98–111)
Creatinine, Ser: 0.78 mg/dL (ref 0.44–1.00)
GFR, Estimated: >60 mL/min
Glucose, Bld: 100 mg/dL — ABNORMAL HIGH (ref 70–99)
Potassium: 3.9 mmol/L (ref 3.5–5.1)
Sodium: 139 mmol/L (ref 135–145)
Total Bilirubin: 0.3 mg/dL (ref 0.0–1.2)
Total Protein: 8.1 g/dL (ref 6.5–8.1)

## 2024-12-26 LAB — CBC
HCT: 45.1 % (ref 36.0–46.0)
Hemoglobin: 14.9 g/dL (ref 12.0–15.0)
MCH: 30 pg (ref 26.0–34.0)
MCHC: 33 g/dL (ref 30.0–36.0)
MCV: 90.7 fL (ref 80.0–100.0)
Platelets: 336 K/uL (ref 150–400)
RBC: 4.97 MIL/uL (ref 3.87–5.11)
RDW: 12.9 % (ref 11.5–15.5)
WBC: 8.9 K/uL (ref 4.0–10.5)
nRBC: 0 % (ref 0.0–0.2)

## 2024-12-26 LAB — HCG, SERUM, QUALITATIVE: Preg, Serum: NEGATIVE

## 2024-12-26 LAB — LIPASE, BLOOD: Lipase: 30 U/L (ref 11–51)

## 2024-12-26 LAB — CBG MONITORING, ED: Glucose-Capillary: 118 mg/dL — ABNORMAL HIGH (ref 70–99)

## 2024-12-26 MED ORDER — SODIUM CHLORIDE 0.9 % IV BOLUS
1000.0000 mL | Freq: Once | INTRAVENOUS | Status: AC
  Administered 2024-12-26: 1000 mL via INTRAVENOUS

## 2024-12-26 MED ORDER — IOHEXOL 300 MG/ML  SOLN
100.0000 mL | Freq: Once | INTRAMUSCULAR | Status: AC | PRN
  Administered 2024-12-26: 100 mL via INTRAVENOUS

## 2024-12-26 MED ORDER — ONDANSETRON HCL 4 MG/2ML IJ SOLN
4.0000 mg | Freq: Once | INTRAMUSCULAR | Status: AC
  Administered 2024-12-26: 4 mg via INTRAVENOUS
  Filled 2024-12-26: qty 2

## 2024-12-26 NOTE — ED Triage Notes (Signed)
 Abd pain and diarrhea x 3 weeks intermittently

## 2024-12-26 NOTE — Discharge Instructions (Signed)
 Make an appointment to follow-up with your primary care doctor or a gastroenterologist.  Return to the emergency room if you have any worsening symptoms.

## 2024-12-26 NOTE — ED Provider Notes (Signed)
 " [place] EMERGENCY DEPARTMENT AT [facility] Provider Note   CSN: [id number] Arrival date & time: 12/26/24  1830     Patient presents with: Abdominal Pain   Michele Wise is a 39 y.o. female.   Patient is a 39 year old female who presents with abdominal pain.  She says she has had diarrhea for about 3 weeks.  It is watery nonbloody diarrhea.  She is having about 3 or 4 times a day.  She also has abdominal pain associated with it.  She has had some nausea but no vomiting.  No fevers.  No cough or cold symptoms.  No urinary symptoms.  No recent travel.  No recent antibiotic usage.  No history of prior GI issues.  She was in triage and had just had her blood drawn.  She said she got nauseated and lightheaded and had a witnessed syncopal episode.  She did not fall out of the chair.  There was no injuries.  She did not have any chest pain or palpitations during this episode.        Prior to Admission medications  Medication Sig Start Date End Date Taking? Authorizing Provider  amphetamine -dextroamphetamine  (ADDERALL) 15 MG tablet Take 1 tablet (15 mg total) by mouth 2 (two) times daily. 03/02/14   Copland, Harlene BROCKS, MD  amphetamine -dextroamphetamine  (ADDERALL) 15 MG tablet Take 1 tablet (15 mg total) by mouth 2 (two) times daily. 03/02/14   Copland, Harlene BROCKS, MD  amphetamine -dextroamphetamine  (ADDERALL) 15 MG tablet Take 1 tablet (15 mg total) by mouth 2 (two) times daily. May fill on 04/29/2014 03/02/14   Copland, Harlene BROCKS, MD  Cholecalciferol (VITAMIN D3) 5000 units CAPS Take by mouth.    [provider]  HYDROcodone -homatropine (HYCODAN) 5-1.5 MG/5ML syrup Take 5 mLs by mouth at bedtime as needed. 07/08/18   Christopher Savannah, PA-C  penicillin  v potassium (VEETID) 500 MG tablet Take 1 tablet (500 mg total) by mouth 3 (three) times daily. 07/15/18   Christopher Savannah, PA-C  RECLIPSEN  0.15-30 MG-MCG tablet TAKE ONE TABLET BY MOUTH DAILY 08/03/13   Egan, Eleanore E, PA-C    Allergies:  Patient has no known allergies.    Review of Systems  Constitutional:  Positive for fatigue. Negative for chills, diaphoresis and fever.  HENT:  Negative for congestion, rhinorrhea and sneezing.   Eyes: Negative.   Respiratory:  Negative for cough, chest tightness and shortness of breath.   Cardiovascular:  Negative for chest pain and leg swelling.  Gastrointestinal:  Positive for abdominal pain, diarrhea and nausea. Negative for blood in stool and vomiting.  Genitourinary:  Negative for difficulty urinating, flank pain, frequency and hematuria.  Musculoskeletal:  Negative for arthralgias and back pain.  Skin:  Negative for rash.  Neurological:  Positive for syncope. Negative for dizziness, speech difficulty, weakness, numbness and headaches.    Updated Vital Signs BP 114/80   Pulse 89   Temp 98.4 F (36.9 C) (Oral)   Resp 16   SpO2 100%   Physical Exam Constitutional:      Appearance: She is well-developed.  HENT:     Head: Normocephalic and atraumatic.  Eyes:     Pupils: Pupils are equal, round, and reactive to light.  Cardiovascular:     Rate and Rhythm: Normal rate and regular rhythm.     Heart sounds: Normal heart sounds.  Pulmonary:     Effort: Pulmonary effort is normal. No respiratory distress.     Breath sounds: Normal breath sounds. No wheezing  or rales.  Chest:     Chest wall: No tenderness.  Abdominal:     General: Bowel sounds are normal.     Palpations: Abdomen is soft.     Tenderness: There is abdominal tenderness in the right upper quadrant, right lower quadrant and periumbilical area. There is no guarding or rebound.  Musculoskeletal:        General: Normal range of motion.     Cervical back: Normal range of motion and neck supple.  Lymphadenopathy:     Cervical: No cervical adenopathy.  Skin:    General: Skin is warm and dry.     Findings: No rash.  Neurological:     Mental Status: She is alert and oriented to person, place, and time.     (all  labs ordered are listed, but only abnormal results are displayed) Labs Reviewed  COMPREHENSIVE METABOLIC PANEL WITH GFR - Abnormal; Notable for the following components:      Result Value   Glucose, Bld 100 (*)    All other components within normal limits  URINALYSIS, ROUTINE W REFLEX MICROSCOPIC - Abnormal; Notable for the following components:   APPearance HAZY (*)    Hgb urine dipstick SMALL (*)    Protein, ur 30 (*)    Bacteria, UA RARE (*)    All other components within normal limits  CBG MONITORING, ED - Abnormal; Notable for the following components:   Glucose-Capillary 118 (*)    All other components within normal limits  C DIFFICILE QUICK SCREEN W PCR REFLEX    GASTROINTESTINAL PANEL BY PCR, STOOL (REPLACES STOOL CULTURE)  LIPASE, BLOOD  CBC  HCG, SERUM, QUALITATIVE    EKG: EKG Interpretation Date/Time:  Friday December 26 2024 19:06:03 EST Ventricular Rate:  88 PR Interval:  148 QRS Duration:  94 QT Interval:  363 QTC Calculation: 440 R Axis:   70  Text Interpretation: Sinus rhythm Left atrial enlargement Baseline wander in lead(s) V4 V5 No old tracing to compare Confirmed by Lenor Hollering [phone]) on 12/26/2024 8:12:48 PM  Radiology: CT ABDOMEN PELVIS W CONTRAST Result Date: 12/26/2024 CLINICAL DATA:  Acute abdominal pain, diarrhea for 3 weeks EXAM: CT ABDOMEN AND PELVIS WITH CONTRAST TECHNIQUE: Multidetector CT imaging of the abdomen and pelvis was performed using the standard protocol following bolus administration of intravenous contrast. RADIATION DOSE REDUCTION: This exam was performed according to the departmental dose-optimization program which includes automated exposure control, adjustment of the mA and/or kV according to patient size and/or use of iterative reconstruction technique. CONTRAST:  100mL OMNIPAQUE  IOHEXOL  300 MG/ML  SOLN COMPARISON:  None Available. FINDINGS: Lower chest: No acute pleural or parenchymal lung disease. Hepatobiliary: Hepatic steatosis.  Indeterminate hypodensities are seen within the right lobe liver, measuring 1.2 cm reference image 17/2 and 0.7 cm image 21/2. Favor small cysts or hemangiomas. Gallbladder is unremarkable. Pancreas: Unremarkable. No pancreatic ductal dilatation or surrounding inflammatory changes. Spleen: Normal in size without focal abnormality. Adrenals/Urinary Tract: Adrenal glands are unremarkable. Kidneys are normal, without renal calculi, focal lesion, or hydronephrosis. Bladder is unremarkable. Stomach/Bowel: No bowel obstruction or ileus. Normal retrocecal appendix. No bowel wall thickening or inflammatory change. Vascular/Lymphatic: No significant vascular findings are present. No enlarged abdominal or pelvic lymph nodes. 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 Heterogeneity within the uterine fundus suggests underlying fibroids as well. No adnexal masses. Other: No free fluid or free intraperitoneal gas. No abdominal wall hernia. Musculoskeletal: No acute or destructive bony abnormalities. Reconstructed images demonstrate no additional  findings. IMPRESSION: 1. No acute intra-abdominal or intrapelvic process. 2. 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 If further evaluation is required, pelvic ultrasound could be performed. 3. Indeterminate hypodensities right lobe liver, favor small cysts or hemangiomas. Electronically Signed   By: Ozell Daring M.D.   On: 12/26/2024 20:45     Procedures   Medications Ordered in the ED  sodium chloride  0.9 % bolus 1,000 mL (1,000 mLs Intravenous New Bag/Given 12/26/24 1941)  ondansetron  (ZOFRAN ) injection 4 mg (4 mg Intravenous Given 12/26/24 1941)  iohexol  (OMNIPAQUE ) 300 MG/ML solution 100 mL (100 mLs Intravenous Contrast Given 12/26/24 2030)                                    Medical Decision Making Amount and/or Complexity of Data Reviewed Labs: ordered. Radiology:  ordered.  Risk Prescription drug management.   This patient presents to the ED for concern of diarrhea, abdominal pain, this involves an extensive number of treatment options, and is a complaint that carries with it a high risk of complications and morbidity.  I considered the following differential and admission for this acute, potentially life threatening condition.  The differential diagnosis includes gastroenteritis, colitis, inflammatory bowel disease.  C. difficile, appendicitis, cholelithiasis  MDM:    Patient is a 39 year old who presents with a 3-week history of diarrhea associated with abdominal pain.  It is persistent pain but worse before she has the diarrhea.  No fevers.  She has had some nausea.  She had a syncopal episode that sounds like a vasovagal type spell when she was getting her blood drawn here in the ED. labs show normal WBC count, no significant anemia, electrolytes nonconcerning, LFTs normal, lipase normal, urinalysis is not concerning for infection.  Pregnancy test is negative.  CT scan of her abdomen pelvis did not show any acute abnormality.  There was some concerns for uterine fibroids and the bicornuate uterus but I do not think that is related to her issues today.  She does not have any current pelvic symptoms.  However I did discuss these findings with her.  She was given IV fluids.  She is feeling better.  She was unable to get a stool sample for testing.  She was discharged home in good condition.  Was given symptomatic care instructions.  Was given information about following up with gastroenterology or advised her she can follow-up with her PCP.  Return precautions were given.  (Labs, imaging, consults)  Labs: I Ordered, and personally interpreted labs.  The pertinent results include: Normal white count, electrolytes nonconcerning, lipase normal, pregnancy test negative, urinalysis normal  Imaging Studies ordered: I ordered imaging studies including CT abdomen  pelvis I independently visualized and interpreted imaging. I agree with the radiologist interpretation  Additional history obtained from  .  External records from outside source obtained and reviewed including prior notes  Cardiac Monitoring: The patient was maintained on a cardiac monitor.  If on the cardiac monitor, I personally viewed and interpreted the cardiac monitored which showed an underlying rhythm of: Sinus rhythm  Reevaluation: After the interventions noted above, I reevaluated the patient and found that they have :improved  Social Determinants of Health:    Disposition: Discharged to home  Co morbidities that complicate the patient evaluation  Past Medical History:  Diagnosis Date   ADD (attention deficit disorder)  Medicines Meds ordered this encounter  Medications   sodium chloride  0.9 % bolus 1,000 mL   ondansetron  (ZOFRAN ) injection 4 mg   iohexol  (OMNIPAQUE ) 300 MG/ML solution 100 mL    I have reviewed the patients home medicines and have made adjustments as needed  Problem List / ED Course: Problem List Items Addressed This Visit   None Visit Diagnoses       Diarrhea, unspecified type    -  Primary     Syncope, unspecified syncope type                    Final diagnoses:  Diarrhea, unspecified type  Syncope, unspecified syncope type    ED Discharge Orders     None          Lenor Hollering, MD 12/26/24 2255  "

## 2025-02-20 ENCOUNTER — Encounter: Payer: Self-pay | Admitting: Obstetrics and Gynecology
